# Patient Record
Sex: Female | Born: 1965 | Race: White | Hispanic: No | Marital: Married | Smoking: Never smoker
Health system: Southern US, Community
[De-identification: ages and names within clinical notes are randomized; demographics above are authoritative.]

## PROBLEM LIST (undated history)

## (undated) DIAGNOSIS — E785 Hyperlipidemia, unspecified: Secondary | ICD-10-CM

## (undated) DIAGNOSIS — K449 Diaphragmatic hernia without obstruction or gangrene: Secondary | ICD-10-CM

## (undated) DIAGNOSIS — I10 Essential (primary) hypertension: Secondary | ICD-10-CM

## (undated) DIAGNOSIS — E119 Type 2 diabetes mellitus without complications: Secondary | ICD-10-CM

## (undated) DIAGNOSIS — C4499 Other specified malignant neoplasm of skin, unspecified: Secondary | ICD-10-CM

## (undated) DIAGNOSIS — G629 Polyneuropathy, unspecified: Secondary | ICD-10-CM

## (undated) DIAGNOSIS — K219 Gastro-esophageal reflux disease without esophagitis: Secondary | ICD-10-CM

## (undated) DIAGNOSIS — S5400XA Injury of ulnar nerve at forearm level, unspecified arm, initial encounter: Secondary | ICD-10-CM

## (undated) DIAGNOSIS — C4490 Unspecified malignant neoplasm of skin, unspecified: Secondary | ICD-10-CM

## (undated) HISTORY — DX: Gastro-esophageal reflux disease without esophagitis: K21.9

## (undated) HISTORY — DX: Injury of ulnar nerve at forearm level, unspecified arm, initial encounter: S54.00XA

## (undated) HISTORY — DX: Diaphragmatic hernia without obstruction or gangrene: K44.9

## (undated) HISTORY — DX: Other specified malignant neoplasm of skin, unspecified: C44.99

## (undated) HISTORY — DX: Type 2 diabetes mellitus without complications: E11.9

## (undated) HISTORY — DX: Essential (primary) hypertension: I10

## (undated) HISTORY — DX: Hyperlipidemia, unspecified: E78.5

## (undated) HISTORY — PX: CHOLECYSTECTOMY: SHX55

## (undated) HISTORY — DX: Unspecified malignant neoplasm of skin, unspecified: C44.90

## (undated) HISTORY — PX: AUGMENTATION MAMMAPLASTY: SUR837

## (undated) HISTORY — PX: REDUCTION MAMMAPLASTY: SUR839

## (undated) HISTORY — DX: Polyneuropathy, unspecified: G62.9

## (undated) HISTORY — PX: ABDOMINAL HYSTERECTOMY: SHX81

---

## 1999-07-05 HISTORY — PX: BREAST REDUCTION SURGERY: SHX8

## 2005-08-12 DIAGNOSIS — E1142 Type 2 diabetes mellitus with diabetic polyneuropathy: Secondary | ICD-10-CM | POA: Insufficient documentation

## 2008-10-09 DIAGNOSIS — K8020 Calculus of gallbladder without cholecystitis without obstruction: Secondary | ICD-10-CM | POA: Insufficient documentation

## 2008-11-14 DIAGNOSIS — Z90710 Acquired absence of both cervix and uterus: Secondary | ICD-10-CM | POA: Insufficient documentation

## 2010-04-07 DIAGNOSIS — R928 Other abnormal and inconclusive findings on diagnostic imaging of breast: Secondary | ICD-10-CM | POA: Insufficient documentation

## 2016-06-20 DIAGNOSIS — Z85828 Personal history of other malignant neoplasm of skin: Secondary | ICD-10-CM | POA: Insufficient documentation

## 2016-10-17 HISTORY — PX: COLONOSCOPY: SHX174

## 2016-10-17 HISTORY — PX: ESOPHAGOGASTRODUODENOSCOPY: SHX1529

## 2016-10-17 LAB — HM COLONOSCOPY

## 2017-05-10 DIAGNOSIS — I10 Essential (primary) hypertension: Secondary | ICD-10-CM | POA: Insufficient documentation

## 2020-01-28 DIAGNOSIS — K219 Gastro-esophageal reflux disease without esophagitis: Secondary | ICD-10-CM | POA: Insufficient documentation

## 2020-01-28 HISTORY — PX: ESOPHAGOGASTRODUODENOSCOPY: SHX1529

## 2020-11-25 ENCOUNTER — Ambulatory Visit: Payer: Self-pay | Admitting: Family Medicine

## 2020-11-26 ENCOUNTER — Other Ambulatory Visit: Payer: Self-pay

## 2020-11-26 ENCOUNTER — Encounter: Payer: Self-pay | Admitting: Family Medicine

## 2020-11-26 ENCOUNTER — Ambulatory Visit (INDEPENDENT_AMBULATORY_CARE_PROVIDER_SITE_OTHER): Payer: Managed Care, Other (non HMO) | Admitting: Family Medicine

## 2020-11-26 VITALS — BP 159/73 | HR 71 | Temp 97.5°F | Ht 72.05 in | Wt 232.5 lb

## 2020-11-26 DIAGNOSIS — E1142 Type 2 diabetes mellitus with diabetic polyneuropathy: Secondary | ICD-10-CM

## 2020-11-26 DIAGNOSIS — Z1231 Encounter for screening mammogram for malignant neoplasm of breast: Secondary | ICD-10-CM | POA: Insufficient documentation

## 2020-11-26 DIAGNOSIS — I10 Essential (primary) hypertension: Secondary | ICD-10-CM

## 2020-11-26 DIAGNOSIS — E785 Hyperlipidemia, unspecified: Secondary | ICD-10-CM

## 2020-11-26 DIAGNOSIS — Z794 Long term (current) use of insulin: Secondary | ICD-10-CM

## 2020-11-26 DIAGNOSIS — K449 Diaphragmatic hernia without obstruction or gangrene: Secondary | ICD-10-CM

## 2020-11-26 DIAGNOSIS — K219 Gastro-esophageal reflux disease without esophagitis: Secondary | ICD-10-CM

## 2020-11-26 DIAGNOSIS — E1169 Type 2 diabetes mellitus with other specified complication: Secondary | ICD-10-CM

## 2020-11-26 MED ORDER — INSULIN GLARGINE 100 UNIT/ML ~~LOC~~ SOLN: 42.0000 [IU] | Freq: Every day | SUBCUTANEOUS | 1 refills | Status: DC

## 2020-11-26 MED ORDER — LOSARTAN POTASSIUM 100 MG PO TABS: 1.0000 | ORAL_TABLET | Freq: Every day | ORAL | 1 refills | Status: DC

## 2020-11-26 MED ORDER — PANTOPRAZOLE SODIUM 40 MG PO TBEC: 40.0000 mg | DELAYED_RELEASE_TABLET | Freq: Every day | ORAL | 1 refills | Status: DC

## 2020-11-26 MED ORDER — ATORVASTATIN CALCIUM 10 MG PO TABS: 10.0000 mg | ORAL_TABLET | Freq: Every day | ORAL | 1 refills | Status: DC

## 2020-11-26 NOTE — Assessment & Plan Note (Signed)
Continue PPI.  Referral to GI for increased symptoms.

## 2020-11-26 NOTE — Assessment & Plan Note (Signed)
Tolerating atorvastatin well, will reduce atorvastatin to 10mg .

## 2020-11-26 NOTE — Progress Notes (Signed)
Gwendolyn Fleming - 55 y.o. female MRN [id number]  Date of birth: 1966-01-24  Subjective Chief Complaint  Patient presents with  . Establish Care  . Diabetes  . Leg Swelling    HPI Gwendolyn Fleming is a 55 y.o. female here today for initial visit to establish care.  She and her husband recently moved here from [place].  She has a history of T2DM with HLD, HTN and peripheral neuropathy.  Records from [place] in [place] reviewed via [facility].  Diabetes is managed with combination of lantus 42 units daily and jardiance 25mg  daily.  She is doing well with this.  Her last a1c in 09/2020 was 7.1%.  She denies symptoms of hypoglycemia.  She does have associated neuropathy.  She also has hLD that is treated with atorvastatin 20mg .  Her last LDL was 46 in 06/2020.  No side effects with statin.   BP is treated with losartan 100mg  daily.  She is doing well with this. She denies side effects.  She has not had chest pain shortness of breath, palpitations, headache or vision changes.    She has had issue with hiatal hernia.  Feels like food gets stuck when swallowing.  She is taking pantoprazole but doesn't feel like his is fully controlling symptoms.    ROS:  A comprehensive ROS was completed and negative except as noted per HPI  ROS:  A comprehensive ROS was completed and negative except as noted per HPI   Allergies  Allergen Reactions  . Penicillins   . Cephalosporins   . Omeprazole Diarrhea  . Metformin Nausea And Vomiting    Upset stomach  . Other Rash    General Anesthesia  . Tape Rash    Past Medical History:  Diagnosis Date  . GERD (gastroesophageal reflux disease)   . Hiatal hernia   . Hyperlipemia   . Hypertension   . Peripheral neuropathy   . Skin carcinoma   . Type II diabetes mellitus ([place])   . Ulnar nerve damage    bilateral    Past Surgical History:  Procedure Laterality Date  . ABDOMINAL HYSTERECTOMY    . BREAST REDUCTION SURGERY    . CESAREAN SECTION    .  CHOLECYSTECTOMY      Social History   Socioeconomic History  . Marital status: Married    Spouse name: Adisen Bennion  . Number of children: Not on file  . Years of education: Not on file  . Highest education level: Not on file  Occupational History  . Occupation: Ambulance person  Tobacco Use  . Smoking status: Never Smoker  . Smokeless tobacco: Never Used  Vaping Use  . Vaping Use: Never used  Substance and Sexual Activity  . Alcohol use: Never  . Drug use: Never  . Sexual activity: Not Currently    Partners: Male  Other Topics Concern  . Not on file  Social History Narrative  . Not on file   Social Determinants of Health   Financial Resource Strain: Not on file  Food Insecurity: Not on file  Transportation Needs: Not on file  Physical Activity: Not on file  Stress: Not on file  Social Connections: Not on file    Family History  Problem Relation Age of Onset  . Skin cancer Mother   . Diabetes Father   . Hypertension Father   . Diabetes Brother   . Diabetes Maternal Grandmother   . Breast cancer Cousin     Health Maintenance  Topic Date Due  .  HEMOGLOBIN A1C  Never done  . COVID-19 Vaccine (1) Never done  . FOOT EXAM  Never done  . OPHTHALMOLOGY EXAM  Never done  . HIV Screening  Never done  . Hepatitis C Screening  Never done  . COLONOSCOPY (Pts 45-69yrs Insurance coverage will need to be confirmed)  Never done  . MAMMOGRAM  Never done  . Zoster Vaccines- Shingrix (1 of 2) Never done  . INFLUENZA VACCINE  02/01/2021  . TETANUS/TDAP  08/01/2030  . PNEUMOCOCCAL POLYSACCHARIDE VACCINE AGE 66-64 HIGH RISK  Completed  . HPV VACCINES  Aged Out  . PAP SMEAR-Modifier  Discontinued     ----------------------------------------------------------------------------------------------------------------------------------------------------------------------------------------------------------------- Physical Exam BP (!) 159/73 (BP Location: Left Arm, Patient  Position: Sitting, Cuff Size: Large)   Pulse 71   Temp (!) 97.5 F (36.4 C)   Ht 6' 0.05" (1.83 m)   Wt 232 lb 8 oz (105.5 kg)   SpO2 99%   BMI 31.49 kg/m   Physical Exam Constitutional:      Appearance: Normal appearance.  HENT:     Head: Normocephalic and atraumatic.  Eyes:     General: No scleral icterus. Musculoskeletal:     Cervical back: Neck supple.  Neurological:     General: No focal deficit present.     Mental Status: She is alert.  Psychiatric:        Mood and Affect: Mood normal.        Behavior: Behavior normal.     ------------------------------------------------------------------------------------------------------------------------------------------------------------------------------------------------------------------- Assessment and Plan  HTN (hypertension) BP elevated today.  Has been historically controlled with losartan.  Continue at current dose and recommend low sodium diet.  Recommend home monitoring as well.   Type 2 diabetes mellitus with diabetic polyneuropathy (HCC) Recently a1c indicates fairly good control of glucose.  Continue lantus at current dosing and jardiance.  Return in about 3 months (around 02/26/2021) for HTN/T2DM.   Encounter for screening mammogram for malignant neoplasm of breast Referral for mammogram  Hiatal hernia with GERD Continue PPI.  Referral to GI for increased symptoms.   Hyperlipidemia associated with type 2 diabetes mellitus ([place]) Tolerating atorvastatin well, will reduce atorvastatin to 10mg .    Meds ordered this encounter  Medications  . insulin glargine (LANTUS) 100 UNIT/ML injection    Sig: Inject 0.42 mLs (42 Units total) into the skin daily.    Dispense:  40 mL    Refill:  1  . losartan (COZAAR) 100 MG tablet    Sig: Take 1 tablet (100 mg total) by mouth daily.    Dispense:  90 tablet    Refill:  1  . pantoprazole (PROTONIX) 40 MG tablet    Sig: Take 1 tablet (40 mg total) by mouth daily.     Dispense:  90 tablet    Refill:  1  . atorvastatin (LIPITOR) 10 MG tablet    Sig: Take 1 tablet (10 mg total) by mouth daily.    Dispense:  90 tablet    Refill:  1    Return in about 3 months (around 02/26/2021) for HTN/T2DM.    This visit occurred during the SARS-CoV-2 public health emergency.  Safety protocols were in place, including screening questions prior to the visit, additional usage of staff PPE, and extensive cleaning of exam room while observing appropriate contact time as indicated for disinfecting solutions.

## 2020-11-26 NOTE — Patient Instructions (Addendum)
Great to meet you today! Let's try decreasing atorvastatin to 10mg  daily.  Continue all other medications.  Follow up with me in 3 months.

## 2020-11-26 NOTE — Assessment & Plan Note (Signed)
BP elevated today.  Has been historically controlled with losartan.  Continue at current dose and recommend low sodium diet.  Recommend home monitoring as well.

## 2020-11-26 NOTE — Assessment & Plan Note (Signed)
Recently a1c indicates fairly good control of glucose.  Continue lantus at current dosing and jardiance.  Return in about 3 months (around 02/26/2021) for HTN/T2DM.

## 2020-11-26 NOTE — Assessment & Plan Note (Signed)
Referral for mammogram

## 2020-11-27 ENCOUNTER — Telehealth: Payer: Self-pay | Admitting: Gastroenterology

## 2020-11-27 NOTE — Telephone Encounter (Signed)
Hey Dr Lyndel Safe, this pt is being referred to [place] from [facility] for a hiatal hernia but it looks like the pt has seen [facility] GI 01/2020, records are In epic for review, please advise on scheduling.

## 2020-12-02 NOTE — Telephone Encounter (Signed)
OK to schedule for OV APP clinic or mine, whichever is sooner RG

## 2020-12-17 ENCOUNTER — Other Ambulatory Visit: Payer: Self-pay

## 2020-12-17 ENCOUNTER — Ambulatory Visit (INDEPENDENT_AMBULATORY_CARE_PROVIDER_SITE_OTHER): Payer: Managed Care, Other (non HMO)

## 2020-12-17 DIAGNOSIS — Z1231 Encounter for screening mammogram for malignant neoplasm of breast: Secondary | ICD-10-CM | POA: Diagnosis not present

## 2020-12-30 ENCOUNTER — Other Ambulatory Visit: Payer: Self-pay

## 2020-12-30 ENCOUNTER — Encounter: Payer: Self-pay | Admitting: Family Medicine

## 2020-12-30 DIAGNOSIS — E1142 Type 2 diabetes mellitus with diabetic polyneuropathy: Secondary | ICD-10-CM

## 2020-12-30 MED ORDER — "BD VEO INSULIN SYRINGE U/F 31G X 15/64"" 0.5 ML MISC": 2 refills | Status: DC

## 2020-12-31 ENCOUNTER — Other Ambulatory Visit: Payer: Self-pay | Admitting: Family Medicine

## 2020-12-31 MED ORDER — BASAGLAR KWIKPEN 100 UNIT/ML ~~LOC~~ SOPN: 42.0000 [IU] | PEN_INJECTOR | Freq: Every day | SUBCUTANEOUS | 2 refills | Status: DC

## 2020-12-31 MED ORDER — NOVOFINE PEN NEEDLE 32G X 6 MM MISC: 3 refills | Status: DC

## 2021-01-09 LAB — HM DIABETES EYE EXAM

## 2021-01-15 ENCOUNTER — Encounter: Payer: Self-pay | Admitting: Family Medicine

## 2021-01-15 NOTE — Progress Notes (Signed)
Dr. Clent Demark in [place]  Fax: [phone]

## 2021-01-20 ENCOUNTER — Telehealth: Payer: Self-pay | Admitting: Neurology

## 2021-01-20 NOTE — Telephone Encounter (Signed)
Archived, patient's therapy switched by provider.

## 2021-02-02 ENCOUNTER — Other Ambulatory Visit: Payer: Self-pay

## 2021-02-25 ENCOUNTER — Ambulatory Visit: Payer: Managed Care, Other (non HMO) | Admitting: Family Medicine

## 2021-02-25 ENCOUNTER — Encounter: Payer: Self-pay | Admitting: Family Medicine

## 2021-02-25 ENCOUNTER — Other Ambulatory Visit: Payer: Self-pay

## 2021-02-25 VITALS — BP 135/80 | HR 66 | Temp 97.6°F | Ht 72.0 in | Wt 230.0 lb

## 2021-02-25 DIAGNOSIS — E1169 Type 2 diabetes mellitus with other specified complication: Secondary | ICD-10-CM

## 2021-02-25 DIAGNOSIS — E1142 Type 2 diabetes mellitus with diabetic polyneuropathy: Secondary | ICD-10-CM | POA: Diagnosis not present

## 2021-02-25 DIAGNOSIS — I10 Essential (primary) hypertension: Secondary | ICD-10-CM

## 2021-02-25 DIAGNOSIS — R1319 Other dysphagia: Secondary | ICD-10-CM | POA: Diagnosis not present

## 2021-02-25 DIAGNOSIS — Z794 Long term (current) use of insulin: Secondary | ICD-10-CM

## 2021-02-25 DIAGNOSIS — E785 Hyperlipidemia, unspecified: Secondary | ICD-10-CM

## 2021-02-25 MED ORDER — NOVOFINE PEN NEEDLE 32G X 6 MM MISC: 3 refills | Status: DC

## 2021-02-25 MED ORDER — NOVOFINE PEN NEEDLE 32G X 6 MM MISC: 0 refills | Status: DC

## 2021-02-25 NOTE — Patient Instructions (Signed)
Great to see you today! I will have GI office reach out to you.  Continue current medication.  Follow up in 6 months.

## 2021-02-26 LAB — CBC WITH DIFFERENTIAL/PLATELET
Absolute Monocytes: 609 cells/uL (ref 200–950)
Basophils Absolute: 26 cells/uL (ref 0–200)
Basophils Relative: 0.3 %
Eosinophils Absolute: 157 cells/uL (ref 15–500)
Eosinophils Relative: 1.8 %
HCT: 42.2 % (ref 35.0–45.0)
Hemoglobin: 14.2 g/dL (ref 11.7–15.5)
Lymphs Abs: 2018 cells/uL (ref 850–3900)
MCH: 30.6 pg (ref 27.0–33.0)
MCHC: 33.6 g/dL (ref 32.0–36.0)
MCV: 90.9 fL (ref 80.0–100.0)
MPV: 10.2 fL (ref 7.5–12.5)
Monocytes Relative: 7 %
Neutro Abs: 5890 cells/uL (ref 1500–7800)
Neutrophils Relative %: 67.7 %
Platelets: 271 10*3/uL (ref 140–400)
RBC: 4.64 10*6/uL (ref 3.80–5.10)
RDW: 12.3 % (ref 11.0–15.0)
Total Lymphocyte: 23.2 %
WBC: 8.7 10*3/uL (ref 3.8–10.8)

## 2021-02-26 LAB — COMPLETE METABOLIC PANEL WITH GFR
AG Ratio: 1.4 (calc) (ref 1.0–2.5)
ALT: 20 U/L (ref 6–29)
AST: 15 U/L (ref 10–35)
Albumin: 4 g/dL (ref 3.6–5.1)
Alkaline phosphatase (APISO): 81 U/L (ref 37–153)
BUN: 12 mg/dL (ref 7–25)
CO2: 29 mmol/L (ref 20–32)
Calcium: 9.4 mg/dL (ref 8.6–10.4)
Chloride: 102 mmol/L (ref 98–110)
Creat: 0.75 mg/dL (ref 0.50–1.03)
Globulin: 2.9 g/dL (calc) (ref 1.9–3.7)
Glucose, Bld: 119 mg/dL — ABNORMAL HIGH (ref 65–99)
Potassium: 4.7 mmol/L (ref 3.5–5.3)
Sodium: 139 mmol/L (ref 135–146)
Total Bilirubin: 0.3 mg/dL (ref 0.2–1.2)
Total Protein: 6.9 g/dL (ref 6.1–8.1)
eGFR: 93 mL/min/{1.73_m2} (ref >=60)

## 2021-02-26 LAB — LIPID PANEL W/REFLEX DIRECT LDL
Cholesterol: 157 mg/dL (ref <200)
HDL: 45 mg/dL — ABNORMAL LOW (ref >=50)
LDL Cholesterol (Calc): 70 mg/dL (calc)
Non-HDL Cholesterol (Calc): 112 mg/dL (calc) (ref <130)
Total CHOL/HDL Ratio: 3.5 (calc) (ref <5.0)
Triglycerides: 347 mg/dL — ABNORMAL HIGH (ref <150)

## 2021-02-26 LAB — HEMOGLOBIN A1C
Hgb A1c MFr Bld: 6.7 % of total Hgb — ABNORMAL HIGH (ref <5.7)
Mean Plasma Glucose: 146 mg/dL
eAG (mmol/L): 8.1 mmol/L

## 2021-02-26 MED ORDER — NOVOFINE PEN NEEDLE 32G X 6 MM MISC: 0 refills | Status: DC

## 2021-02-28 ENCOUNTER — Encounter: Payer: Self-pay | Admitting: Family Medicine

## 2021-02-28 DIAGNOSIS — R1319 Other dysphagia: Secondary | ICD-10-CM | POA: Insufficient documentation

## 2021-02-28 NOTE — Assessment & Plan Note (Signed)
She is tolerating atorvastatin well.  Updating lipid panel today.

## 2021-02-28 NOTE — Progress Notes (Signed)
Gwendolyn Fleming - 55 y.o. female MRN [id number]  Date of birth: [birth year]  Subjective Chief Complaint  Patient presents with   Diabetes    HPI Gwendolyn Fleming is a 55 year old female here today for follow-up visit.  She has a history of diabetes and is treated with basal insulin and Jardiance.  She was using Lantus however this was no longer covered with her new insurance.  She was transitioned to [employer] however is unsure how to use these.  She does not currently have pen needles and is going out of town this week.  She does think that she has enough Lantus in her current vial to make it through the weekend.  She reports that overall she is doing well with managing her blood sugars.  She is tolerating atorvastatin well for management of associated hyperlipidemia.  GERD is managed with pantoprazole.  This continues to work well for her reflux symptoms however she is having some esophageal dysphagia.  She has had esophageal stretching in the past.  She is planning on starting a new supplement from [employer] to hopefully help with her gut health.  She is a firm believer in their essential oil products as well.  ROS:  A comprehensive ROS was completed and negative except as noted per HPI  Allergies  Allergen Reactions   Penicillins    Cephalosporins    Omeprazole Diarrhea   Metformin Nausea And Vomiting    Upset stomach   Other Rash    General Anesthesia   Tape Rash    Past Medical History:  Diagnosis Date   GERD (gastroesophageal reflux disease)    Hiatal hernia    Hyperlipemia    Hypertension    Peripheral neuropathy    Skin carcinoma    Type II diabetes mellitus ([place])    Ulnar nerve damage    bilateral    Past Surgical History:  Procedure Laterality Date   ABDOMINAL HYSTERECTOMY     AUGMENTATION MAMMAPLASTY     BREAST REDUCTION SURGERY Bilateral 2001   CESAREAN SECTION     CHOLECYSTECTOMY      Social History   Socioeconomic History   Marital status: Married    Spouse  name: Gwendolyn Fleming   Number of children: Not on file   Years of education: Not on file   Highest education level: Not on file  Occupational History   Occupation: Ambulance person  Tobacco Use   Smoking status: Never   Smokeless tobacco: Never  Vaping Use   Vaping Use: Never used  Substance and Sexual Activity   Alcohol use: Never   Drug use: Never   Sexual activity: Not Currently    Partners: Male  Other Topics Concern   Not on file  Social History Narrative   Not on file   Social Determinants of Health   Financial Resource Strain: Not on file  Food Insecurity: Not on file  Transportation Needs: Not on file  Physical Activity: Not on file  Stress: Not on file  Social Connections: Not on file    Family History  Problem Relation Age of Onset   Skin cancer Mother    Diabetes Father    Hypertension Father    Diabetes Brother    Diabetes Maternal Grandmother    Breast cancer Cousin     Health Maintenance  Topic Date Due   COVID-19 Vaccine (1) Never done   OPHTHALMOLOGY EXAM  Never done   HIV Screening  Never done   Hepatitis C Screening  Never done   Zoster Vaccines- Shingrix (1 of 2) Never done   INFLUENZA VACCINE  02/01/2021   HEMOGLOBIN A1C  08/28/2021   FOOT EXAM  02/25/2022   MAMMOGRAM  12/18/2022   COLONOSCOPY (Pts 45-70yr Insurance coverage will need to be confirmed)  10/18/2026   TETANUS/TDAP  08/01/2030   PNEUMOCOCCAL POLYSACCHARIDE VACCINE AGE 55-64 HIGH RISK  Completed   Pneumococcal Vaccine [id number] old  Aged Out   HPV VACCINES  Aged Out   PAP SMEAR-Modifier  Discontinued     ----------------------------------------------------------------------------------------------------------------------------------------------------------------------------------------------------------------- Physical Exam BP 135/80 (BP Location: Left Arm, Patient Position: Sitting, Cuff Size: Large)   Pulse 66   Temp 97.6 F (36.4 C)   Ht 6' (1.829 m)   Wt 230  lb (104.3 kg)   SpO2 93%   BMI 31.19 kg/m   Physical Exam Constitutional:      Appearance: Normal appearance.  HENT:     Head: Normocephalic and atraumatic.  Eyes:     General: No scleral icterus. Cardiovascular:     Rate and Rhythm: Normal rate and regular rhythm.  Musculoskeletal:     Cervical back: Neck supple.  Neurological:     General: No focal deficit present.     Mental Status: She is alert.  Psychiatric:        Mood and Affect: Mood normal.        Behavior: Behavior normal.    ------------------------------------------------------------------------------------------------------------------------------------------------------------------------------------------------------------------- Assessment and Plan  HTN (hypertension) Blood pressure remains well controlled with losartan.  Recommend continuation.  Type 2 diabetes mellitus with diabetic polyneuropathy (HCC) Blood sugars at home have been well controlled.  Updating A1c today.  Instructed on use of Basaglar pen and prescription for pen needles sent in.  She will continue Jardiance in addition to [employer]  Hyperlipidemia associated with type 2 diabetes mellitus ([place] She is tolerating atorvastatin well.  Updating lipid panel today.  Esophageal dysphagia She does have history of hiatal hernia with GERD.  Has required esophageal dilatation in the past.  Referral placed to GI.   Meds ordered this encounter  Medications   DISCONTD: Insulin Pen Needle (NOVOFINE PEN NEEDLE) 32G X 6 MM MISC    Sig: Use to inject insulin    Dispense:  100 each    Refill:  3   DISCONTD: Insulin Pen Needle (NOVOFINE PEN NEEDLE) 32G X 6 MM MISC    Sig: Use to inject insulin    Dispense:  100 each    Refill:  0    Return in about 6 months (around 08/28/2021) for HTN/T2DM.    This visit occurred during the SARS-CoV-2 public health emergency.  Safety protocols were in place, including screening questions prior to the visit,  additional usage of staff PPE, and extensive cleaning of exam room while observing appropriate contact time as indicated for disinfecting solutions.

## 2021-02-28 NOTE — Assessment & Plan Note (Signed)
Blood pressure remains well controlled with losartan.  Recommend continuation.

## 2021-02-28 NOTE — Assessment & Plan Note (Signed)
She does have history of hiatal hernia with GERD.  Has required esophageal dilatation in the past.  Referral placed to GI.

## 2021-02-28 NOTE — Assessment & Plan Note (Signed)
Blood sugars at home have been well controlled.  Updating A1c today.  Instructed on use of Basaglar pen and prescription for pen needles sent in.  She will continue Jardiance in addition to [employer].

## 2021-03-01 ENCOUNTER — Encounter: Payer: Self-pay | Admitting: Family Medicine

## 2021-03-15 ENCOUNTER — Encounter: Payer: Self-pay | Admitting: Family Medicine

## 2021-03-15 MED ORDER — EMPAGLIFLOZIN 25 MG PO TABS: 25.0000 mg | ORAL_TABLET | Freq: Every day | ORAL | 3 refills | Status: DC

## 2021-03-15 NOTE — Telephone Encounter (Signed)
We have not prescribed this medication for the patient previously.  Please review and refill if appropriate.  T. Emma-Lee Oddo, CMA  

## 2021-03-24 ENCOUNTER — Telehealth: Payer: Self-pay

## 2021-03-24 DIAGNOSIS — Z794 Long term (current) use of insulin: Secondary | ICD-10-CM

## 2021-03-24 NOTE — Telephone Encounter (Signed)
Pt called stating that she is out of Jardiance and cannot get it filled until a PA is completed.  Please call pt and update her on the status of PA.  Charyl Bigger, CMA

## 2021-04-07 MED ORDER — BASAGLAR KWIKPEN 100 UNIT/ML ~~LOC~~ SOPN: 42.0000 [IU] | PEN_INJECTOR | Freq: Every day | SUBCUTANEOUS | 2 refills | Status: DC

## 2021-04-07 NOTE — Telephone Encounter (Signed)
Medication: empagliflozin (JARDIANCE) 25 MG TABS tablet Prior authorization determination received Medication has been approved Approval dates: 04/07/2021-07/03/2098  Patient aware via: phone Pharmacy aware: Yes Provider aware via this encounter

## 2021-04-07 NOTE — Addendum Note (Signed)
Addended by: Ranelle Oyster on: 04/07/2021 10:51 AM   Modules accepted: Orders

## 2021-04-07 NOTE — Telephone Encounter (Signed)
Medication: empagliflozin (JARDIANCE) 25 MG TABS tablet Prior authorization submitted via CoverMyMeds on 04/07/2021 PA submission pending MyChart message sent to pt notifying her of the PA submission

## 2021-04-12 NOTE — Telephone Encounter (Signed)
Please see encounter dated 04/07/21

## 2021-04-14 ENCOUNTER — Encounter: Payer: Self-pay | Admitting: Gastroenterology

## 2021-04-14 ENCOUNTER — Other Ambulatory Visit: Payer: Self-pay

## 2021-04-14 ENCOUNTER — Ambulatory Visit: Payer: Managed Care, Other (non HMO) | Admitting: Gastroenterology

## 2021-04-14 VITALS — BP 136/86 | HR 64 | Ht 72.0 in | Wt 226.5 lb

## 2021-04-14 DIAGNOSIS — K219 Gastro-esophageal reflux disease without esophagitis: Secondary | ICD-10-CM | POA: Diagnosis not present

## 2021-04-14 DIAGNOSIS — R1319 Other dysphagia: Secondary | ICD-10-CM | POA: Diagnosis not present

## 2021-04-14 DIAGNOSIS — K449 Diaphragmatic hernia without obstruction or gangrene: Secondary | ICD-10-CM | POA: Diagnosis not present

## 2021-04-14 NOTE — Progress Notes (Addendum)
Chief Complaint: Dysphagia  Referring Provider:  Luetta Nutting, DO      ASSESSMENT AND PLAN;   #1. GERD with small HH  #2. Eso dysphagia. H/O eso stricture 2018.  Most recent EGD 01/2020 did not reveal any stricture  Plan: -Ba swallow with barium tablet -Hold off on PPIs (pt preference) -Instructed to chew food specially meats and breads well and slowly. -EGD with dil if needed (would prefer Jan 2022)  HPI:    Gwendolyn Fleming is a 55 y.o. female  With DM2, non-celiac gluten sensitivity, HTN, HLD, peripheral neuropathy who does not like to take medications and prefers natural therapy including essential oils.  C/O dysphagia - intermittent, more with rice, worsening x 2-3 months, mostly in the mid chest.  No associated heartburn.  An EGD done previously as below showing small hiatal hernia.  She also had esophageal dilatation in April 2018.  She was given a trial of Protonix which worked good.  However, she weaned herself off Protonix and is currently using essential oils.  No further heartburn.  Note that she had diarrhea with omeprazole.  She denies having any odynophagia or dysphagia.  No melena or hematochezia.  No unintentional weight loss.  She has normal bowel movements.  Used to be more constipated before gallbladder surgery.  No sodas, chocolates, chewing gums, artificial sweeteners and candy. No NSAIDs   Past GI procedures:  EGD @[place]  by Dr Jeris Penta 01/28/2020 -Small hiatal hernia -No strictures. Eso Bx- neg for EOE or Barrett's. Eso bx did show acute and chronic inflammation. -Several 2 to 4 mm gastric polyps. Bx-fundic gland polyps  EGD 10/2016 -Small hiatal hernia -GE junction stricture s/p dil 84mm  Screening colonoscopy 10/17/2016: Nl. Rpt in 10 yrs  Past Medical History:  Diagnosis Date   GERD (gastroesophageal reflux disease)    Hiatal hernia    Hyperlipemia    Hypertension    Peripheral neuropathy    Skin carcinoma    Type II diabetes mellitus  ([place])    Ulnar nerve damage    bilateral    Past Surgical History:  Procedure Laterality Date   ABDOMINAL HYSTERECTOMY     AUGMENTATION MAMMAPLASTY     BREAST REDUCTION SURGERY Bilateral 2001   CESAREAN SECTION     CHOLECYSTECTOMY      Family History  Problem Relation Age of Onset   Skin cancer Mother    Irritable bowel syndrome Mother    Diabetes Father    Hypertension Father    Diabetes Brother    Diabetes Maternal Grandmother    Diverticulitis Maternal Grandmother    Breast cancer Cousin    Colon cancer Neg Hx    Rectal cancer Neg Hx    Pancreatic cancer Neg Hx    Esophageal cancer Neg Hx    Liver cancer Neg Hx    Stomach cancer Neg Hx     Social History   Tobacco Use   Smoking status: Never   Smokeless tobacco: Never  Vaping Use   Vaping Use: Never used  Substance Use Topics   Alcohol use: Never   Drug use: Never    Current Outpatient Medications  Medication Sig Dispense Refill   Alcohol Swabs (ALCOHOL WIPES) 70 % PADS Apply topically See admin instructions.     atorvastatin (LIPITOR) 10 MG tablet Take 1 tablet (10 mg total) by mouth daily. 90 tablet 1   empagliflozin (JARDIANCE) 25 MG TABS tablet Take 1 tablet (25 mg total) by mouth daily. 90 tablet  3   glucose blood (ONETOUCH VERIO) test strip Check your blood sugar 2 times a day (Every morning before breakfast and every evening before dinner)     Insulin Glargine (BASAGLAR KWIKPEN) 100 UNIT/ML Inject 42 Units into the skin daily. 15 mL 2   Insulin Pen Needle (NOVOFINE PEN NEEDLE) 32G X 6 MM MISC Use to inject insulin 100 each 0   Lancets (ONETOUCH DELICA PLUS [id number]) MISC Use as directed to test blood sugar. For use with Delica Lancing device     losartan (COZAAR) 100 MG tablet Take 1 tablet (100 mg total) by mouth daily. 90 tablet 1   No current facility-administered medications for this visit.    Allergies  Allergen Reactions   Penicillins    Cephalosporins    Omeprazole Diarrhea   Metformin  Nausea And Vomiting    Upset stomach   Other Rash    General Anesthesia   Tape Rash    Review of Systems:  Constitutional: Denies fever, chills, diaphoresis, appetite change and fatigue.  HEENT: Denies photophobia, eye pain, redness, hearing loss, ear pain, congestion, sore throat, rhinorrhea, sneezing, mouth sores, neck pain, neck stiffness and tinnitus.   Respiratory: Denies SOB, DOE, cough, chest tightness,  and wheezing.   Cardiovascular: Denies chest pain, palpitations and leg swelling.  Genitourinary: Denies dysuria, urgency, frequency, hematuria, flank pain and difficulty urinating.  Musculoskeletal: Denies myalgias, back pain, joint swelling, arthralgias and gait problem.  Skin: No rash.  Neurological: Denies dizziness, seizures, syncope, weakness, light-headedness, numbness and headaches.  Hematological: Denies adenopathy. Easy bruising, personal or family bleeding history  Psychiatric/Behavioral: No anxiety or depression     Physical Exam:    BP 136/86   Pulse 64   Ht 6' (1.829 m)   Wt 226 lb 8 oz (102.7 kg)   SpO2 98%   BMI 30.72 kg/m  Wt Readings from Last 3 Encounters:  04/14/21 226 lb 8 oz (102.7 kg)  02/25/21 230 lb (104.3 kg)  11/26/20 232 lb 8 oz (105.5 kg)   Constitutional:  Well-developed, in no acute distress. Psychiatric: Normal mood and affect. Behavior is normal. HEENT: Pupils normal.  Conjunctivae are normal. No scleral icterus. Cardiovascular: Normal rate, regular rhythm. No edema Pulmonary/chest: Effort normal and breath sounds normal. No wheezing, rales or rhonchi. Abdominal: Soft, nondistended. Nontender. Bowel sounds active throughout. There are no masses palpable. No hepatomegaly. Rectal: Deferred Neurological: Alert and oriented to person place and time. Skin: Skin is warm and dry. No rashes noted.  Data Reviewed: I have personally reviewed following labs and imaging studies  CBC: CBC Latest Ref Rng & Units 02/25/2021  WBC 3.8 - 10.8  Thousand/uL 8.7  Hemoglobin 11.7 - 15.5 g/dL 14.2  Hematocrit 35.0 - 45.0 % 42.2  Platelets 140 - 400 Thousand/uL 271    CMP: CMP Latest Ref Rng & Units 02/25/2021  Glucose 65 - 99 mg/dL 119(H)  BUN 7 - 25 mg/dL 12  Creatinine 0.50 - 1.03 mg/dL 0.75  Sodium 135 - 146 mmol/L 139  Potassium 3.5 - 5.3 mmol/L 4.7  Chloride 98 - 110 mmol/L 102  CO2 20 - 32 mmol/L 29  Calcium 8.6 - 10.4 mg/dL 9.4  Total Protein 6.1 - 8.1 g/dL 6.9  Total Bilirubin 0.2 - 1.2 mg/dL 0.3  AST 10 - 35 U/L 15  ALT 6 - 29 U/L 20       Carmell Austria, MD 04/14/2021, 9:23 AM  Cc: Luetta Nutting, DO

## 2021-04-14 NOTE — Patient Instructions (Signed)
If you are age 57 or older, your body mass index should be between 23-30. Your Body mass index is 30.72 kg/m. If this is out of the aforementioned range listed, please consider follow up with your Primary Care Provider.  If you are age 66 or younger, your body mass index should be between 19-25. Your Body mass index is 30.72 kg/m. If this is out of the aformentioned range listed, please consider follow up with your Primary Care Provider.   __________________________________________________________  The [facility] providers would like to encourage you to use [facility] to communicate with providers for non-urgent requests or questions.  Due to long hold times on the telephone, sending your provider a message by [facility] may be a faster and more efficient way to get a response.  Please allow 48 business hours for a response.  Please remember that this is for non-urgent requests.    You have been scheduled for a Barium Esophogram at [facility] (1st floor of the hospital) on  04-23-2021 at 1030am . Please arrive 15 minutes prior to your appointment for registration. Make certain not to have anything to eat or drink 3 hours prior to your test. If you need to reschedule for any reason, please contact radiology at [phone] to do so. __________________________________________________________________ A barium swallow is an examination that concentrates on views of the esophagus. This tends to be a double contrast exam (barium and two liquids which, when combined, create a gas to distend the wall of the oesophagus) or single contrast (non-ionic iodine based). The study is usually tailored to your symptoms so a good history is essential. Attention is paid during the study to the form, structure and configuration of the esophagus, looking for functional disorders (such as aspiration, dysphagia, achalasia, motility and reflux) EXAMINATION You may be asked to change into a gown, depending on the type of  swallow being performed. A radiologist and radiographer will perform the procedure. The radiologist will advise you of the type of contrast selected for your procedure and direct you during the exam. You will be asked to stand, sit or lie in several different positions and to hold a small amount of fluid in your mouth before being asked to swallow while the imaging is performed .In some instances you may be asked to swallow barium coated marshmallows to assess the motility of a solid food bolus. The exam can be recorded as a digital or video fluoroscopy procedure. POST PROCEDURE It will take 1-2 days for the barium to pass through your system. To facilitate this, it is important, unless otherwise directed, to increase your fluids for the next 24-48hrs and to resume your normal diet.  This test typically takes about 30 minutes to perform. __________________________________________________________________________________  Please call with any questions or concerns.  Thank you,  Dr. Jackquline Denmark

## 2021-04-23 ENCOUNTER — Ambulatory Visit (HOSPITAL_COMMUNITY)
Admission: RE | Admit: 2021-04-23 | Discharge: 2021-04-23 | Disposition: A | Discharge status: Home / Self Care | Payer: Managed Care, Other (non HMO) | Source: Ambulatory Visit | Attending: Gastroenterology | Admitting: Gastroenterology

## 2021-04-23 ENCOUNTER — Other Ambulatory Visit: Payer: Self-pay

## 2021-04-23 DIAGNOSIS — K219 Gastro-esophageal reflux disease without esophagitis: Secondary | ICD-10-CM | POA: Insufficient documentation

## 2021-04-23 DIAGNOSIS — R1319 Other dysphagia: Secondary | ICD-10-CM | POA: Insufficient documentation

## 2021-04-23 DIAGNOSIS — K449 Diaphragmatic hernia without obstruction or gangrene: Secondary | ICD-10-CM | POA: Insufficient documentation

## 2021-04-28 ENCOUNTER — Other Ambulatory Visit: Payer: Self-pay

## 2021-04-28 DIAGNOSIS — R1319 Other dysphagia: Secondary | ICD-10-CM

## 2021-05-12 ENCOUNTER — Other Ambulatory Visit: Payer: Self-pay

## 2021-05-12 DIAGNOSIS — E1142 Type 2 diabetes mellitus with diabetic polyneuropathy: Secondary | ICD-10-CM

## 2021-05-12 DIAGNOSIS — Z794 Long term (current) use of insulin: Secondary | ICD-10-CM

## 2021-05-12 MED ORDER — BASAGLAR KWIKPEN 100 UNIT/ML ~~LOC~~ SOPN: 42.0000 [IU] | PEN_INJECTOR | Freq: Every day | SUBCUTANEOUS | 2 refills | Status: DC

## 2021-05-13 ENCOUNTER — Other Ambulatory Visit: Payer: Self-pay | Admitting: Family Medicine

## 2021-05-14 MED ORDER — NOVOFINE PEN NEEDLE 32G X 6 MM MISC: 0 refills | Status: DC

## 2021-05-17 ENCOUNTER — Ambulatory Visit (AMBULATORY_SURGERY_CENTER): Payer: Managed Care, Other (non HMO) | Admitting: Gastroenterology

## 2021-05-17 ENCOUNTER — Encounter: Payer: Self-pay | Admitting: Gastroenterology

## 2021-05-17 VITALS — BP 128/64 | HR 78 | Temp 97.3°F | Resp 14 | Ht 72.0 in | Wt 226.0 lb

## 2021-05-17 DIAGNOSIS — K317 Polyp of stomach and duodenum: Secondary | ICD-10-CM

## 2021-05-17 DIAGNOSIS — R1319 Other dysphagia: Secondary | ICD-10-CM

## 2021-05-17 DIAGNOSIS — K219 Gastro-esophageal reflux disease without esophagitis: Secondary | ICD-10-CM | POA: Diagnosis not present

## 2021-05-17 DIAGNOSIS — K222 Esophageal obstruction: Secondary | ICD-10-CM | POA: Diagnosis not present

## 2021-05-17 DIAGNOSIS — K449 Diaphragmatic hernia without obstruction or gangrene: Secondary | ICD-10-CM | POA: Diagnosis not present

## 2021-05-17 DIAGNOSIS — N63 Unspecified lump in unspecified breast: Secondary | ICD-10-CM | POA: Diagnosis not present

## 2021-05-17 MED ORDER — SODIUM CHLORIDE 0.9 % IV SOLN: 500.0000 mL | Freq: Once | INTRAVENOUS | Status: DC

## 2021-05-17 MED ORDER — PANTOPRAZOLE SODIUM 40 MG PO TBEC: 40.0000 mg | DELAYED_RELEASE_TABLET | Freq: Every day | ORAL | 4 refills | Status: DC

## 2021-05-17 NOTE — Progress Notes (Signed)
Pt's states no medical or surgical changes since previsit or office visit. 

## 2021-05-17 NOTE — Op Note (Signed)
[place] Patient Name: Gwendolyn Fleming Procedure Date: 05/17/2021 9:45 AM MRN: [id number] Endoscopist: Jackquline Denmark , MD Age: 55 Referring MD:  Date of Birth: December 10, 1965 Gender: Female Account #: [id number] Procedure:                Upper GI endoscopy Indications:              Dysphagia with barium swallow showing small hiatal                            hernia. H/O distal esophageal stricture s/p                            dilatation in past. Medicines:                Monitored Anesthesia Care Procedure:                Pre-Anesthesia Assessment:                           - Prior to the procedure, a History and Physical                            was performed, and patient medications and                            allergies were reviewed. The patient's tolerance of                            previous anesthesia was also reviewed. The risks                            and benefits of the procedure and the sedation                            options and risks were discussed with the patient.                            All questions were answered, and informed consent                            was obtained. Prior Anticoagulants: The patient has                            taken no previous anticoagulant or antiplatelet                            agents. ASA Grade Assessment: II - A patient with                            mild systemic disease. After reviewing the risks                            and benefits, the patient was deemed in  satisfactory condition to undergo the procedure.                           After obtaining informed consent, the endoscope was                            passed under direct vision. Throughout the                            procedure, the patient's blood pressure, pulse, and                            oxygen saturations were monitored continuously. The                            Endoscope was introduced through the  mouth, and                            advanced to the second part of duodenum. The upper                            GI endoscopy was accomplished without difficulty.                            The patient tolerated the procedure well. Scope In: Scope Out: Findings:                 The esophagus was mildly torturous. Biopsies were                            not obtained from proximal/mid esophagus as                            previous bx were negative for EoE.                           One benign-appearing, intrinsic moderate                            (circumferential scarring or stenosis; an endoscope                            may pass) stenosis was found 35 cm from the                            incisors at GE junction. This stenosis measured 1.4                            cm (inner diameter) with surrounding mild                            esophagitis. Multiple biopsies were obtained and                            sent for histology.  The stenosis was traversed. The                            scope was withdrawn. Dilation was performed with a                            Maloney dilator with mild resistance at 50 Fr and                            52 Fr.                           A small hiatal hernia was present.                           Localized mild inflammation characterized by                            erythema was found in the gastric antrum. Biopsies                            were taken with a cold forceps for histology.                           A few 4 to 6 mm semi-sessile polyps with no                            stigmata of recent bleeding were found in the                            gastric fundus and in the gastric body. Biopsies                            were taken with a cold forceps for histology.                           The examined duodenum was normal. Biopsies for                            histology were taken with a cold forceps for                             evaluation of celiac disease. Complications:            No immediate complications. Estimated Blood Loss:     Estimated blood loss: none. Impression:               - Benign-appearing esophageal stenosis with                            surrounding esophagitis. Biopsied and dilated.                           - Small hiatal hernia.                           -  Gastritis. Biopsied.                           - A few gastric polyps. Biopsied.                           - Normal examined duodenum. Biopsied. Recommendation:           - Patient has a contact number available for                            emergencies. The signs and symptoms of potential                            delayed complications were discussed with the                            patient. Return to normal activities tomorrow.                            Written discharge instructions were provided to the                            patient.                           - Post dilatation diet.                           - Patient took herself off Protonix (does not like                            to take meds). I believe, we have to go back on                            Protonix 40 mg PO QD #90, 4 refills                           - Await pathology results.                           - The findings and recommendations were discussed                            with the patient's family.                           - If continued problems, would consider esophageal                            manometry. Jackquline Denmark, MD 05/17/2021 10:14:53 AM This report has been signed electronically.

## 2021-05-17 NOTE — Progress Notes (Signed)
Chief Complaint: Dysphagia  Referring Provider:  Luetta Nutting, DO      ASSESSMENT AND PLAN;   #1. GERD with small HH  #2. Eso dysphagia. H/O eso stricture 2018.  Most recent EGD 01/2020 did not reveal any stricture  Plan: -Ba swallow with barium tablet -Hold off on PPIs (pt preference) -Instructed to chew food specially meats and breads well and slowly. -EGD with dil if needed (would prefer Jan 2022)  HPI:    Gwendolyn Fleming is a 55 y.o. female  With DM2, non-celiac gluten sensitivity, HTN, HLD, peripheral neuropathy who does not like to take medications and prefers natural therapy including essential oils.  C/O dysphagia - intermittent, more with rice, worsening x 2-3 months, mostly in the mid chest.  No associated heartburn.  An EGD done previously as below showing small hiatal hernia.  She also had esophageal dilatation in April 2018.  She was given a trial of Protonix which worked good.  However, she weaned herself off Protonix and is currently using essential oils.  No further heartburn.  Note that she had diarrhea with omeprazole.  She denies having any odynophagia or dysphagia.  No melena or hematochezia.  No unintentional weight loss.  She has normal bowel movements.  Used to be more constipated before gallbladder surgery.  No sodas, chocolates, chewing gums, artificial sweeteners and candy. No NSAIDs   Past GI procedures:  EGD @[place]  by Dr Jeris Penta 01/28/2020 -Small hiatal hernia -No strictures. Eso Bx- neg for EOE or Barrett's. Eso bx did show acute and chronic inflammation. -Several 2 to 4 mm gastric polyps. Bx-fundic gland polyps  EGD 10/2016 -Small hiatal hernia -GE junction stricture s/p dil 22mm  Screening colonoscopy 10/17/2016: Nl. Rpt in 10 yrs  Past Medical History:  Diagnosis Date   GERD (gastroesophageal reflux disease)    Hiatal hernia    Hyperlipemia    Hypertension    Peripheral neuropathy    Skin carcinoma    Type II diabetes mellitus  ([place])    Ulnar nerve damage    bilateral    Past Surgical History:  Procedure Laterality Date   ABDOMINAL HYSTERECTOMY     AUGMENTATION MAMMAPLASTY     BREAST REDUCTION SURGERY Bilateral 2001   CESAREAN SECTION     CHOLECYSTECTOMY     COLONOSCOPY  10/17/2016   [facility]. Normal colonoscopy. In Epic   ESOPHAGOGASTRODUODENOSCOPY  10/17/2016   [place]. Normal upper endoscopy except a small hiatal hernia was observed. 63mm stricture was noted at esophago-gastric junction. Mucosa inside the stricture appear normal. Endoscope passed through. Multiple biopsies were taken. Dilitation: esophagus 37cm from the incisors up to 80mm   ESOPHAGOGASTRODUODENOSCOPY  01/28/2020   Small hiatal hernia.GE junction widely patent without stricture noted. s/p forcep biopsy taken from mid to distal esophagus. Esophagus appeared normal. Stomach body and fundus areas several small 2 to 4 mm polyps scattered in stomach body/fundus areas, probably benign fundic gland polyps; s/p sample forceps biopsy taken from stomach body area.Stomach appeared normal. Duodenum apperared normal.    Family History  Problem Relation Age of Onset   Skin cancer Mother    Irritable bowel syndrome Mother    Diabetes Father    Hypertension Father    Diabetes Brother    Diabetes Maternal Grandmother    Diverticulitis Maternal Grandmother    Breast cancer Cousin    Colon cancer Neg Hx    Rectal cancer Neg Hx    Pancreatic cancer Neg Hx  Esophageal cancer Neg Hx    Liver cancer Neg Hx    Stomach cancer Neg Hx     Social History   Tobacco Use   Smoking status: Never   Smokeless tobacco: Never  Vaping Use   Vaping Use: Never used  Substance Use Topics   Alcohol use: Never   Drug use: Never    Current Outpatient Medications  Medication Sig Dispense Refill   Alcohol Swabs (ALCOHOL WIPES) 70 % PADS Apply topically See admin instructions.     atorvastatin (LIPITOR)  10 MG tablet Take 1 tablet (10 mg total) by mouth daily. 90 tablet 1   empagliflozin (JARDIANCE) 25 MG TABS tablet Take 1 tablet (25 mg total) by mouth daily. 90 tablet 3   glucose blood (ONETOUCH VERIO) test strip Check your blood sugar 2 times a day (Every morning before breakfast and every evening before dinner)     Insulin Glargine (BASAGLAR KWIKPEN) 100 UNIT/ML Inject 42 Units into the skin daily. 15 mL 2   Insulin Pen Needle (NOVOFINE PEN NEEDLE) 32G X 6 MM MISC Use to inject insulin 100 each 0   Lancets (ONETOUCH DELICA PLUS [id number]) MISC Use as directed to test blood sugar. For use with Delica Lancing device     losartan (COZAAR) 100 MG tablet Take 1 tablet (100 mg total) by mouth daily. 90 tablet 1   Current Facility-Administered Medications  Medication Dose Route Frequency Provider Last Rate Last Admin   0.9 %  sodium chloride infusion  500 mL Intravenous Once Jackquline Denmark, MD        Allergies  Allergen Reactions   Penicillins    Cephalosporins    Omeprazole Diarrhea   Metformin Nausea And Vomiting    Upset stomach   Other Rash    General Anesthesia   Tape Rash    Review of Systems:  Constitutional: Denies fever, chills, diaphoresis, appetite change and fatigue.  HEENT: Denies photophobia, eye pain, redness, hearing loss, ear pain, congestion, sore throat, rhinorrhea, sneezing, mouth sores, neck pain, neck stiffness and tinnitus.   Respiratory: Denies SOB, DOE, cough, chest tightness,  and wheezing.   Cardiovascular: Denies chest pain, palpitations and leg swelling.  Genitourinary: Denies dysuria, urgency, frequency, hematuria, flank pain and difficulty urinating.  Musculoskeletal: Denies myalgias, back pain, joint swelling, arthralgias and gait problem.  Skin: No rash.  Neurological: Denies dizziness, seizures, syncope, weakness, light-headedness, numbness and headaches.  Hematological: Denies adenopathy. Easy bruising, personal or family bleeding history   Psychiatric/Behavioral: No anxiety or depression     Physical Exam:    BP 130/75   Pulse 69   Temp (!) 97.3 F (36.3 C)   Resp 13   Ht 6' (1.829 m)   Wt 226 lb (102.5 kg)   SpO2 99%   BMI 30.65 kg/m  Wt Readings from Last 3 Encounters:  05/17/21 226 lb (102.5 kg)  04/14/21 226 lb 8 oz (102.7 kg)  02/25/21 230 lb (104.3 kg)   Constitutional:  Well-developed, in no acute distress. Psychiatric: Normal mood and affect. Behavior is normal. HEENT: Pupils normal.  Conjunctivae are normal. No scleral icterus. Cardiovascular: Normal rate, regular rhythm. No edema Pulmonary/chest: Effort normal and breath sounds normal. No wheezing, rales or rhonchi. Abdominal: Soft, nondistended. Nontender. Bowel sounds active throughout. There are no masses palpable. No hepatomegaly. Rectal: Deferred Neurological: Alert and oriented to person place and time. Skin: Skin is warm and dry. No rashes noted.  Data Reviewed: I have personally reviewed following labs and imaging  studies  CBC: CBC Latest Ref Rng & Units 02/25/2021  WBC 3.8 - 10.8 Thousand/uL 8.7  Hemoglobin 11.7 - 15.5 g/dL 14.2  Hematocrit 35.0 - 45.0 % 42.2  Platelets 140 - 400 Thousand/uL 271    CMP: CMP Latest Ref Rng & Units 02/25/2021  Glucose 65 - 99 mg/dL 119(H)  BUN 7 - 25 mg/dL 12  Creatinine 0.50 - 1.03 mg/dL 0.75  Sodium 135 - 146 mmol/L 139  Potassium 3.5 - 5.3 mmol/L 4.7  Chloride 98 - 110 mmol/L 102  CO2 20 - 32 mmol/L 29  Calcium 8.6 - 10.4 mg/dL 9.4  Total Protein 6.1 - 8.1 g/dL 6.9  Total Bilirubin 0.2 - 1.2 mg/dL 0.3  AST 10 - 35 U/L 15  ALT 6 - 29 U/L 20       Carmell Austria, MD 05/17/2021, 9:50 AM  Cc: Luetta Nutting, DO

## 2021-05-17 NOTE — Patient Instructions (Signed)
Please read handouts provided. Continue present medications. Await pathology results. Post Dilation Diet. Begin Protonix 40 mg daily.   YOU HAD AN ENDOSCOPIC PROCEDURE TODAY AT [facility]:   Refer to the procedure report that was given to you for any specific questions about what was found during the examination.  If the procedure report does not answer your questions, please call your gastroenterologist to clarify.  If you requested that your care partner not be given the details of your procedure findings, then the procedure report has been included in a sealed envelope for you to review at your convenience later.  YOU SHOULD EXPECT: Some feelings of bloating in the abdomen. Passage of more gas than usual.  Walking can help get rid of the air that was put into your GI tract during the procedure and reduce the bloating. If you had a lower endoscopy (such as a colonoscopy or flexible sigmoidoscopy) you may notice spotting of blood in your stool or on the toilet paper. If you underwent a bowel prep for your procedure, you may not have a normal bowel movement for a few days.  Please Note:  You might notice some irritation and congestion in your nose or some drainage.  This is from the oxygen used during your procedure.  There is no need for concern and it should clear up in a day or so.  SYMPTOMS TO REPORT IMMEDIATELY:    Following upper endoscopy (EGD)  Vomiting of blood or coffee ground material  New chest pain or pain under the shoulder blades  Painful or persistently difficult swallowing  New shortness of breath  Fever of 100F or higher  Black, tarry-looking stools  For urgent or emergent issues, a gastroenterologist can be reached at any hour by calling [phone]. Do not use MyChart messaging for urgent concerns.    DIET:  Drink plenty of fluids but you should avoid alcoholic beverages for 24 hours.  ACTIVITY:  You should plan to take it easy for the rest of  today and you should NOT DRIVE or use heavy machinery until tomorrow (because of the sedation medicines used during the test).    FOLLOW UP: Our staff will call the number listed on your records 48-72 hours following your procedure to check on you and address any questions or concerns that you may have regarding the information given to you following your procedure. If we do not reach you, we will leave a message.  We will attempt to reach you two times.  During this call, we will ask if you have developed any symptoms of COVID 19. If you develop any symptoms (ie: fever, flu-like symptoms, shortness of breath, cough etc.) before then, please call [phone].  If you test positive for Covid 19 in the 2 weeks post procedure, please call and report this information to [place].    If any biopsies were taken you will be contacted by phone or by letter within the next 1-3 weeks.  Please call us at [phone] if you have not heard about the biopsies in 3 weeks.    SIGNATURES/CONFIDENTIALITY: You and/or your care partner have signed paperwork which will be entered into your electronic medical record.  These signatures attest to the fact that that the information above on your After Visit Summary has been reviewed and is understood.  Full responsibility of the confidentiality of this discharge information lies with you and/or your care-partner.

## 2021-05-17 NOTE — Progress Notes (Signed)
Called to room to assist during endoscopic procedure.  Patient ID and intended procedure confirmed with present staff. Received instructions for my participation in the procedure from the performing physician.  

## 2021-05-17 NOTE — Progress Notes (Signed)
Report to PACU, RN, vss, BBS= Clear.  

## 2021-05-17 NOTE — Progress Notes (Signed)
C.W. vital signs. 

## 2021-05-18 ENCOUNTER — Other Ambulatory Visit: Payer: Self-pay

## 2021-05-19 ENCOUNTER — Telehealth: Payer: Self-pay | Admitting: *Deleted

## 2021-05-19 NOTE — Telephone Encounter (Signed)
  Follow up Call-  Call back number 05/17/2021  Post procedure Call Back phone  # [phone]  Permission to leave phone message Yes     Patient questions:  Do you have a fever, pain , or abdominal swelling? No. Pain Score  0 *  Have you tolerated food without any problems? Yes.    Have you been able to return to your normal activities? Yes.    Do you have any questions about your discharge instructions: Diet   No. Medications  No. Follow up visit  No.  Do you have questions or concerns about your Care? No.  Actions: * If pain score is 4 or above: No action needed, pain <4.  Have you developed a fever since your procedure? no  2.   Have you had an respiratory symptoms (SOB or cough) since your procedure? no  3.   Have you tested positive for COVID 19 since your procedure no  4.   Have you had any family members/close contacts diagnosed with the COVID 19 since your procedure?  no   If yes to any of these questions please route to Joylene John, RN and Joella Prince, RN

## 2021-05-23 ENCOUNTER — Encounter: Payer: Self-pay | Admitting: Gastroenterology

## 2021-05-24 ENCOUNTER — Encounter: Payer: Self-pay | Admitting: Family Medicine

## 2021-05-24 MED ORDER — ONETOUCH VERIO W/DEVICE KIT: PACK | Status: DC

## 2021-05-24 MED ORDER — LOSARTAN POTASSIUM 100 MG PO TABS: 100.0000 mg | ORAL_TABLET | Freq: Every day | ORAL | 1 refills | Status: DC

## 2021-05-25 MED ORDER — ONETOUCH VERIO W/DEVICE KIT: PACK | 99 refills | Status: DC

## 2021-05-25 MED ORDER — ONETOUCH VERIO W/DEVICE KIT: PACK | Status: DC

## 2021-05-25 NOTE — Addendum Note (Signed)
Addended by: Narda Rutherford on: 05/25/2021 03:35 PM   Modules accepted: Orders

## 2021-07-29 ENCOUNTER — Telehealth: Payer: Self-pay

## 2021-07-29 NOTE — Telephone Encounter (Signed)
Pt lvm stating she's experienced death in the family in [place]. Needs medication refills.   Returned pt's call. Requested callback or MyChart message with rx refills needed and pharmacy name & address.

## 2021-08-30 ENCOUNTER — Ambulatory Visit: Payer: Managed Care, Other (non HMO) | Admitting: Family Medicine

## 2021-08-30 ENCOUNTER — Encounter: Payer: Self-pay | Admitting: Family Medicine

## 2021-08-30 ENCOUNTER — Other Ambulatory Visit: Payer: Self-pay

## 2021-08-30 VITALS — BP 138/82 | HR 64 | Wt 228.1 lb

## 2021-08-30 DIAGNOSIS — E1169 Type 2 diabetes mellitus with other specified complication: Secondary | ICD-10-CM | POA: Diagnosis not present

## 2021-08-30 DIAGNOSIS — Z794 Long term (current) use of insulin: Secondary | ICD-10-CM | POA: Diagnosis not present

## 2021-08-30 DIAGNOSIS — I10 Essential (primary) hypertension: Secondary | ICD-10-CM | POA: Diagnosis not present

## 2021-08-30 DIAGNOSIS — B351 Tinea unguium: Secondary | ICD-10-CM | POA: Insufficient documentation

## 2021-08-30 DIAGNOSIS — D485 Neoplasm of uncertain behavior of skin: Secondary | ICD-10-CM | POA: Diagnosis not present

## 2021-08-30 DIAGNOSIS — E1142 Type 2 diabetes mellitus with diabetic polyneuropathy: Secondary | ICD-10-CM | POA: Diagnosis not present

## 2021-08-30 DIAGNOSIS — E785 Hyperlipidemia, unspecified: Secondary | ICD-10-CM

## 2021-08-30 LAB — POCT GLYCOSYLATED HEMOGLOBIN (HGB A1C): Hemoglobin A1C: 6.9 % — AB (ref 4.0–5.6)

## 2021-08-30 MED ORDER — TERBINAFINE HCL 250 MG PO TABS: 250.0000 mg | ORAL_TABLET | Freq: Every day | ORAL | 0 refills | Status: AC

## 2021-08-30 MED ORDER — ATORVASTATIN CALCIUM 10 MG PO TABS: 10.0000 mg | ORAL_TABLET | Freq: Every day | ORAL | 3 refills | Status: DC

## 2021-08-30 MED ORDER — BASAGLAR KWIKPEN 100 UNIT/ML ~~LOC~~ SOPN: 45.0000 [IU] | PEN_INJECTOR | Freq: Every day | SUBCUTANEOUS | 3 refills | Status: DC

## 2021-08-30 MED ORDER — LOSARTAN POTASSIUM 100 MG PO TABS: 100.0000 mg | ORAL_TABLET | Freq: Every day | ORAL | 3 refills | Status: DC

## 2021-08-30 MED ORDER — EMPAGLIFLOZIN 25 MG PO TABS: 25.0000 mg | ORAL_TABLET | Freq: Every day | ORAL | 3 refills | Status: DC

## 2021-08-30 NOTE — Patient Instructions (Addendum)
Increase basaglar slightly to 45 units daily.  Add terbinafine for toenail fungus.  'Have labs in 6 weeks.  Follow up in 6 months.

## 2021-08-30 NOTE — Assessment & Plan Note (Signed)
Slight worsening of blood sugar since last visit.  Increasing insulin to 45 units daily.  Continue to work on dietary and lifestyle change.

## 2021-08-30 NOTE — Assessment & Plan Note (Signed)
Tolerating atorvastatin well at current strength.   

## 2021-08-30 NOTE — Progress Notes (Signed)
Gwendolyn Fleming - 56 y.o. female MRN [id number]  Date of birth: 04/22/1966  Subjective No chief complaint on file.   HPI Gwendolyn Fleming is a 55 year old female here today for follow-up visit.  Reports that she feels well today.  She does have some skin lesions she would like evaluated today.  She is using insulin as directed and taking Jardiance daily.  Tolerating these well.  Denies any symptoms of hypoglycemia.  Tolerating atorvastatin well for associated hyperlipidemia.  Blood pressure has remained well controlled with losartan.  Denies chest pain, shortness of breath, palpitations, headache or vision changes.  She has noted thickened, dystrophic nails.  She has had nail loss on the left foot.   ROS:  A comprehensive ROS was completed and negative except as noted per HPI  Allergies  Allergen Reactions   Penicillins    Cephalosporins    Omeprazole Diarrhea   Metformin Nausea And Vomiting    Upset stomach   Other Rash    General Anesthesia   Tape Rash    Past Medical History:  Diagnosis Date   GERD (gastroesophageal reflux disease)    Hiatal hernia    Hyperlipemia    Hypertension    Peripheral neuropathy    Skin carcinoma    Type II diabetes mellitus ([place])    Ulnar nerve damage    bilateral    Past Surgical History:  Procedure Laterality Date   ABDOMINAL HYSTERECTOMY     AUGMENTATION MAMMAPLASTY     BREAST REDUCTION SURGERY Bilateral 2001   CESAREAN SECTION     CHOLECYSTECTOMY     COLONOSCOPY  10/17/2016   [facility]. Normal colonoscopy. In Epic   ESOPHAGOGASTRODUODENOSCOPY  10/17/2016   [place]. Normal upper endoscopy except a small hiatal hernia was observed. 55mm stricture was noted at esophago-gastric junction. Mucosa inside the stricture appear normal. Endoscope passed through. Multiple biopsies were taken. Dilitation: esophagus 37cm from the incisors up to 62mm   ESOPHAGOGASTRODUODENOSCOPY  01/28/2020   Small  hiatal hernia.GE junction widely patent without stricture noted. s/p forcep biopsy taken from mid to distal esophagus. Esophagus appeared normal. Stomach body and fundus areas several small 2 to 4 mm polyps scattered in stomach body/fundus areas, probably benign fundic gland polyps; s/p sample forceps biopsy taken from stomach body area.Stomach appeared normal. Duodenum apperared normal.    Social History   Socioeconomic History   Marital status: Married    Spouse name: Gwendolyn Fleming   Number of children: 2   Years of education: Not on file   Highest education level: Not on file  Occupational History   Occupation: Ambulance person  Tobacco Use   Smoking status: Never   Smokeless tobacco: Never  Vaping Use   Vaping Use: Never used  Substance and Sexual Activity   Alcohol use: Never   Drug use: Never   Sexual activity: Not Currently    Partners: Male  Other Topics Concern   Not on file  Social History Narrative   Not on file   Social Determinants of Health   Financial Resource Strain: Not on file  Food Insecurity: Not on file  Transportation Needs: Not on file  Physical Activity: Not on file  Stress: Not on file  Social Connections: Not on file    Family History  Problem Relation Age of Onset   Skin cancer Mother    Irritable bowel syndrome Mother    Diabetes Father    Hypertension Father    Diabetes Brother  Diabetes Maternal Grandmother    Diverticulitis Maternal Grandmother    Breast cancer Cousin    Colon cancer Neg Hx    Rectal cancer Neg Hx    Pancreatic cancer Neg Hx    Esophageal cancer Neg Hx    Liver cancer Neg Hx    Stomach cancer Neg Hx     Health Maintenance  Topic Date Due   COVID-19 Vaccine (1) Never done   HIV Screening  Never done   Hepatitis C Screening  Never done   Zoster Vaccines- Shingrix (1 of 2) Never done   INFLUENZA VACCINE  Never done   OPHTHALMOLOGY EXAM  01/09/2022   FOOT EXAM  02/25/2022   HEMOGLOBIN A1C  02/27/2022    MAMMOGRAM  12/18/2022   COLONOSCOPY (Pts 45-74yrs Insurance coverage will need to be confirmed)  10/18/2026   TETANUS/TDAP  08/01/2030   HPV VACCINES  Aged Out   PAP SMEAR-Modifier  Discontinued     ----------------------------------------------------------------------------------------------------------------------------------------------------------------------------------------------------------------- Physical Exam BP 138/82    Pulse 64    Wt 228 lb 1.3 oz (103.5 kg)    SpO2 99%    BMI 30.93 kg/m   Physical Exam Constitutional:      Appearance: Normal appearance.  Cardiovascular:     Rate and Rhythm: Normal rate and regular rhythm.  Pulmonary:     Effort: Pulmonary effort is normal.     Breath sounds: Normal breath sounds.  Musculoskeletal:     Cervical back: Neck supple.  Skin:    Comments: Verrucous lesion to the tragus of the left ear. Multiple slightly raised, pigmented, scaly lesions noted along the back.  Please have stuck on appearance.  Neurological:     Mental Status: She is alert.  Psychiatric:        Mood and Affect: Mood normal.        Behavior: Behavior normal.    ------------------------------------------------------------------------------------------------------------------------------------------------------------------------------------------------------------------- Assessment and Plan  HTN (hypertension) Blood pressure remains fairly well controlled.  Recommend continuation of losartan at current strength.  Type 2 diabetes mellitus with diabetic polyneuropathy (HCC) Slight worsening of blood sugar since last visit.  Increasing insulin to 45 units daily.  Continue to work on dietary and lifestyle change.  Hyperlipidemia associated with type 2 diabetes mellitus ([place]) Tolerating atorvastatin well at current strength.  Neoplasm of uncertain behavior of skin She has multiple areas on her back that are consistent with seborrheic keratosis.  She does  have verrucous lesion on the tragus of the left ear.  She would like to see dermatology for these.  Referral entered.  Onychomycosis Starting course of terbinafine x12 weeks.  We will recheck LFTs approximately 6 weeks after starting this.   Meds ordered this encounter  Medications   atorvastatin (LIPITOR) 10 MG tablet    Sig: Take 1 tablet (10 mg total) by mouth daily.    Dispense:  90 tablet    Refill:  3   empagliflozin (JARDIANCE) 25 MG TABS tablet    Sig: Take 1 tablet (25 mg total) by mouth daily.    Dispense:  90 tablet    Refill:  3   Insulin Glargine (BASAGLAR KWIKPEN) 100 UNIT/ML    Sig: Inject 45 Units into the skin daily. May use up to max 60 units per day    Dispense:  40 mL    Refill:  3   losartan (COZAAR) 100 MG tablet    Sig: Take 1 tablet (100 mg total) by mouth daily.    Dispense:  90 tablet  Refill:  3   terbinafine (LAMISIL) 250 MG tablet    Sig: Take 1 tablet (250 mg total) by mouth daily.    Dispense:  84 tablet    Refill:  0    Return in about 6 months (around 02/27/2022) for DM/FBW.    This visit occurred during the SARS-CoV-2 public health emergency.  Safety protocols were in place, including screening questions prior to the visit, additional usage of staff PPE, and extensive cleaning of exam room while observing appropriate contact time as indicated for disinfecting solutions.

## 2021-08-30 NOTE — Assessment & Plan Note (Signed)
She has multiple areas on her back that are consistent with seborrheic keratosis.  She does have verrucous lesion on the tragus of the left ear.  She would like to see dermatology for these.  Referral entered.

## 2021-08-30 NOTE — Assessment & Plan Note (Signed)
Blood pressure remains fairly well controlled.  Recommend continuation of losartan at current strength.

## 2021-08-30 NOTE — Assessment & Plan Note (Signed)
Starting course of terbinafine x12 weeks.  We will recheck LFTs approximately 6 weeks after starting this.

## 2021-10-16 LAB — HEPATIC FUNCTION PANEL
AG Ratio: 1.7 (calc) (ref 1.0–2.5)
ALT: 26 U/L (ref 6–29)
AST: 20 U/L (ref 10–35)
Albumin: 4.3 g/dL (ref 3.6–5.1)
Alkaline phosphatase (APISO): 86 U/L (ref 37–153)
Bilirubin, Direct: 0.1 mg/dL (ref 0.0–0.2)
Globulin: 2.6 g/dL (calc) (ref 1.9–3.7)
Indirect Bilirubin: 0.4 mg/dL (calc) (ref 0.2–1.2)
Total Bilirubin: 0.5 mg/dL (ref 0.2–1.2)
Total Protein: 6.9 g/dL (ref 6.1–8.1)

## 2021-12-06 ENCOUNTER — Encounter: Payer: Self-pay | Admitting: Family Medicine

## 2022-02-16 ENCOUNTER — Encounter: Payer: Self-pay | Admitting: Family Medicine

## 2022-02-16 MED ORDER — PANTOPRAZOLE SODIUM 40 MG PO TBEC: 40.0000 mg | DELAYED_RELEASE_TABLET | Freq: Every day | ORAL | 4 refills | Status: DC

## 2022-02-28 ENCOUNTER — Ambulatory Visit: Payer: Managed Care, Other (non HMO) | Admitting: Family Medicine

## 2022-02-28 ENCOUNTER — Encounter: Payer: Self-pay | Admitting: Family Medicine

## 2022-02-28 VITALS — BP 138/79 | HR 66 | Resp 20 | Ht 72.0 in | Wt 229.1 lb

## 2022-02-28 DIAGNOSIS — Z794 Long term (current) use of insulin: Secondary | ICD-10-CM | POA: Diagnosis not present

## 2022-02-28 DIAGNOSIS — E1169 Type 2 diabetes mellitus with other specified complication: Secondary | ICD-10-CM

## 2022-02-28 DIAGNOSIS — E785 Hyperlipidemia, unspecified: Secondary | ICD-10-CM

## 2022-02-28 DIAGNOSIS — E1142 Type 2 diabetes mellitus with diabetic polyneuropathy: Secondary | ICD-10-CM

## 2022-02-28 DIAGNOSIS — I10 Essential (primary) hypertension: Secondary | ICD-10-CM | POA: Diagnosis not present

## 2022-02-28 LAB — POCT GLYCOSYLATED HEMOGLOBIN (HGB A1C): HbA1c, POC (controlled diabetic range): 7.1 % — AB (ref 0.0–7.0)

## 2022-02-28 MED ORDER — EMPAGLIFLOZIN 25 MG PO TABS: 25.0000 mg | ORAL_TABLET | Freq: Every day | ORAL | 3 refills | Status: DC

## 2022-02-28 NOTE — Patient Instructions (Addendum)
I would recommend adding ozempic to help with blood sugar and weight management.   Let me know if interested in trying.   Continue all other medications.   See me again in 6 months.

## 2022-03-07 NOTE — Assessment & Plan Note (Signed)
Tolerating atorvastatin well.  Continue at current strength.  

## 2022-03-07 NOTE — Progress Notes (Signed)
Gwendolyn Fleming - 56 y.o. female MRN [id number]  Date of birth: 1966-01-04  Subjective Chief Complaint  Patient presents with   Diabetes   Follow-up    HPI Gwendolyn Fleming is a 56 year old female here today for follow-up visit.  Reports she is feeling well at this time.  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  Tolerating current medication well.  She has not really made any significant changes to her diet or exercise patterns.  A1c today is slightly elevated compared to previous reading.  Tolerating atorvastatin well for management of hyperlipidemia.  No side effects at this time.  Blood pressures remained fairly well controlled with losartan.  No side effects at current strength.   ROS:  A comprehensive ROS was completed and negative except as noted per HPI   Allergies  Allergen Reactions   Penicillins    Cephalosporins    Omeprazole Diarrhea   Metformin Nausea And Vomiting    Upset stomach   Other Rash    General Anesthesia   Tape Rash    Past Medical History:  Diagnosis Date   GERD (gastroesophageal reflux disease)    Hiatal hernia    Hyperlipemia    Hypertension    Peripheral neuropathy    Skin carcinoma    Type II diabetes mellitus ([place])    Ulnar nerve damage    bilateral    Past Surgical History:  Procedure Laterality Date   ABDOMINAL HYSTERECTOMY     AUGMENTATION MAMMAPLASTY     BREAST REDUCTION SURGERY Bilateral 2001   CESAREAN SECTION     CHOLECYSTECTOMY     COLONOSCOPY  10/17/2016   [facility]. Normal colonoscopy. In Epic   ESOPHAGOGASTRODUODENOSCOPY  10/17/2016   [place]. Normal upper endoscopy except a small hiatal hernia was observed. 69m stricture was noted at esophago-gastric junction. Mucosa inside the stricture appear normal. Endoscope passed through. Multiple biopsies were taken. Dilitation: esophagus 37cm from the incisors up to 249m  ESOPHAGOGASTRODUODENOSCOPY   01/28/2020   Small hiatal hernia.GE junction widely patent without stricture noted. s/p forcep biopsy taken from mid to distal esophagus. Esophagus appeared normal. Stomach body and fundus areas several small 2 to 4 mm polyps scattered in stomach body/fundus areas, probably benign fundic gland polyps; s/p sample forceps biopsy taken from stomach body area.Stomach appeared normal. Duodenum apperared normal.    Social History   Socioeconomic History   Marital status: Married    Spouse name: Gwendolyn Fleming Number of children: 2   Years of education: Not on file   Highest education level: Not on file  Occupational History   Occupation: CrAmbulance personTobacco Use   Smoking status: Never   Smokeless tobacco: Never  Vaping Use   Vaping Use: Never used  Substance and Sexual Activity   Alcohol use: Never   Drug use: Never   Sexual activity: Not Currently    Partners: Male  Other Topics Concern   Not on file  Social History Narrative   Not on file   Social Determinants of Health   Financial Resource Strain: Not on file  Food Insecurity: Not on file  Transportation Needs: Not on file  Physical Activity: Not on file  Stress: Not on file  Social Connections: Not on file    Family History  Problem Relation Age of Onset   Skin cancer Mother    Irritable bowel syndrome Mother    Diabetes Father    Hypertension Father  Diabetes Brother    Diabetes Maternal Grandmother    Diverticulitis Maternal Grandmother    Breast cancer Cousin    Colon cancer Neg Hx    Rectal cancer Neg Hx    Pancreatic cancer Neg Hx    Esophageal cancer Neg Hx    Liver cancer Neg Hx    Stomach cancer Neg Hx     Health Maintenance  Topic Date Due   HIV Screening  Never done   Hepatitis C Screening  Never done   Diabetic kidney evaluation - Urine ACR  05/03/2020   Diabetic kidney evaluation - GFR measurement  02/25/2022   FOOT EXAM  02/25/2022   HEMOGLOBIN A1C  08/31/2022   MAMMOGRAM   12/18/2022   OPHTHALMOLOGY EXAM  02/06/2023   COLONOSCOPY (Pts 45-5yr Insurance coverage will need to be confirmed)  10/18/2026   TETANUS/TDAP  08/01/2030   HPV VACCINES  Aged Out   INFLUENZA VACCINE  Discontinued   PAP SMEAR-Modifier  Discontinued   COVID-19 Vaccine  Discontinued   Zoster Vaccines- Shingrix  Discontinued     ----------------------------------------------------------------------------------------------------------------------------------------------------------------------------------------------------------------- Physical Exam BP 138/79   Pulse 66   Resp 20   Ht 6' (1.829 m)   Wt 229 lb 1.3 oz (103.9 kg)   SpO2 97%   BMI 31.07 kg/m   Physical Exam Constitutional:      Appearance: Normal appearance.  Eyes:     General: No scleral icterus. Cardiovascular:     Rate and Rhythm: Normal rate and regular rhythm.  Pulmonary:     Effort: Pulmonary effort is normal.     Breath sounds: Normal breath sounds.  Musculoskeletal:     Cervical back: Neck supple.  Neurological:     Mental Status: She is alert.  Psychiatric:        Mood and Affect: Mood normal.        Behavior: Behavior normal.     ------------------------------------------------------------------------------------------------------------------------------------------------------------------------------------------------------------------- Assessment and Plan  HTN (hypertension) Blood pressure is fairly well controlled at this time.  Continue losartan at current strength.  Type 2 diabetes mellitus with diabetic polyneuropathy ([place] Diabetes control has worsened slightly since last visit.  Recommend addition of Ozempic as I think this would benefit her in regards to blood sugar control as well as weight management.  She will think about this and let me know if she would like to try.  Hyperlipidemia associated with type 2 diabetes mellitus ([place] Tolerating atorvastatin well.  Continue at current  strength.   Meds ordered this encounter  Medications   empagliflozin (JARDIANCE) 25 MG TABS tablet    Sig: Take 1 tablet (25 mg total) by mouth daily.    Dispense:  90 tablet    Refill:  3    Return in about 6 months (around 08/31/2022) for HTN/T2DM.    This visit occurred during the SARS-CoV-2 public health emergency.  Safety protocols were in place, including screening questions prior to the visit, additional usage of staff PPE, and extensive cleaning of exam room while observing appropriate contact time as indicated for disinfecting solutions.

## 2022-03-07 NOTE — Assessment & Plan Note (Signed)
Diabetes control has worsened slightly since last visit.  Recommend addition of Ozempic as I think this would benefit her in regards to blood sugar control as well as weight management.  She will think about this and let me know if she would like to try.

## 2022-03-07 NOTE — Assessment & Plan Note (Signed)
Blood pressure is fairly well controlled at this time.  Continue losartan at current strength.

## 2022-04-14 ENCOUNTER — Encounter: Payer: Self-pay | Admitting: Family Medicine

## 2022-06-20 ENCOUNTER — Other Ambulatory Visit: Payer: Self-pay | Admitting: Family Medicine

## 2022-07-26 DIAGNOSIS — M9905 Segmental and somatic dysfunction of pelvic region: Secondary | ICD-10-CM | POA: Diagnosis not present

## 2022-07-26 DIAGNOSIS — M47812 Spondylosis without myelopathy or radiculopathy, cervical region: Secondary | ICD-10-CM | POA: Diagnosis not present

## 2022-07-26 DIAGNOSIS — M9903 Segmental and somatic dysfunction of lumbar region: Secondary | ICD-10-CM | POA: Diagnosis not present

## 2022-07-26 DIAGNOSIS — M4726 Other spondylosis with radiculopathy, lumbar region: Secondary | ICD-10-CM | POA: Diagnosis not present

## 2022-08-09 DIAGNOSIS — M9905 Segmental and somatic dysfunction of pelvic region: Secondary | ICD-10-CM | POA: Diagnosis not present

## 2022-08-09 DIAGNOSIS — M4726 Other spondylosis with radiculopathy, lumbar region: Secondary | ICD-10-CM | POA: Diagnosis not present

## 2022-08-09 DIAGNOSIS — M47812 Spondylosis without myelopathy or radiculopathy, cervical region: Secondary | ICD-10-CM | POA: Diagnosis not present

## 2022-08-09 DIAGNOSIS — M9903 Segmental and somatic dysfunction of lumbar region: Secondary | ICD-10-CM | POA: Diagnosis not present

## 2022-08-23 DIAGNOSIS — M9903 Segmental and somatic dysfunction of lumbar region: Secondary | ICD-10-CM | POA: Diagnosis not present

## 2022-08-23 DIAGNOSIS — M47812 Spondylosis without myelopathy or radiculopathy, cervical region: Secondary | ICD-10-CM | POA: Diagnosis not present

## 2022-08-23 DIAGNOSIS — M9905 Segmental and somatic dysfunction of pelvic region: Secondary | ICD-10-CM | POA: Diagnosis not present

## 2022-08-23 DIAGNOSIS — M4726 Other spondylosis with radiculopathy, lumbar region: Secondary | ICD-10-CM | POA: Diagnosis not present

## 2022-09-05 ENCOUNTER — Encounter: Payer: Self-pay | Admitting: Family Medicine

## 2022-09-05 ENCOUNTER — Ambulatory Visit: Payer: BLUE CROSS/BLUE SHIELD | Admitting: Family Medicine

## 2022-09-05 VITALS — BP 117/79 | HR 105 | Ht 72.0 in | Wt 227.0 lb

## 2022-09-05 DIAGNOSIS — E785 Hyperlipidemia, unspecified: Secondary | ICD-10-CM | POA: Diagnosis not present

## 2022-09-05 DIAGNOSIS — E1142 Type 2 diabetes mellitus with diabetic polyneuropathy: Secondary | ICD-10-CM

## 2022-09-05 DIAGNOSIS — R1312 Dysphagia, oropharyngeal phase: Secondary | ICD-10-CM | POA: Diagnosis not present

## 2022-09-05 DIAGNOSIS — I10 Essential (primary) hypertension: Secondary | ICD-10-CM | POA: Diagnosis not present

## 2022-09-05 DIAGNOSIS — E1169 Type 2 diabetes mellitus with other specified complication: Secondary | ICD-10-CM | POA: Diagnosis not present

## 2022-09-05 DIAGNOSIS — R1319 Other dysphagia: Secondary | ICD-10-CM

## 2022-09-05 DIAGNOSIS — Z794 Long term (current) use of insulin: Secondary | ICD-10-CM | POA: Diagnosis not present

## 2022-09-05 LAB — POCT GLYCOSYLATED HEMOGLOBIN (HGB A1C): HbA1c, POC (controlled diabetic range): 7 % (ref 0.0–7.0)

## 2022-09-05 MED ORDER — TOUJEO SOLOSTAR 300 UNIT/ML ~~LOC~~ SOPN: 45.0000 [IU] | PEN_INJECTOR | Freq: Every day | SUBCUTANEOUS | 1 refills | Status: DC

## 2022-09-05 NOTE — Progress Notes (Signed)
[place] - 57 y.o. female MRN [id number]  Date of birth: 11-02-1965  Subjective No chief complaint on file.   HPI Gwendolyn Fleming is a 57 y.o. female here today for follow up visit.    She reports that she is doing pretty well.  Continues on basaglar and jardiance for management of diabetes.  Injecting  38 units of basaglar daily.  She is trying to stay moderately active.  She has  not really made any significant changes to her diet.   Remains on losartan for management of HTN.  Tolerating this well.  Denies chest pain, shortness of breath, palpitations, headache or vision changes.   Tolerating atorvastatin well for management of HLD.  Due for updated lipid panel.   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   ROS:  A comprehensive ROS was completed and negative except as noted per HPI  Allergies  Allergen Reactions   Penicillins    Cephalosporins    Omeprazole Diarrhea   Metformin Nausea And Vomiting    Upset stomach   Other Rash    General Anesthesia   Tape Rash    Past Medical History:  Diagnosis Date   GERD (gastroesophageal reflux disease)    Hiatal hernia    Hyperlipemia    Hypertension    Peripheral neuropathy    Skin carcinoma    Type II diabetes mellitus ([place])    Ulnar nerve damage    bilateral    Past Surgical History:  Procedure Laterality Date   ABDOMINAL HYSTERECTOMY     AUGMENTATION MAMMAPLASTY     BREAST REDUCTION SURGERY Bilateral 2001   CESAREAN SECTION     CHOLECYSTECTOMY     COLONOSCOPY  10/17/2016   [facility]. Normal colonoscopy. In Epic   ESOPHAGOGASTRODUODENOSCOPY  10/17/2016   [place]. Normal upper endoscopy except a small hiatal hernia was observed. 30m stricture was noted at esophago-gastric junction. Mucosa inside the stricture appear normal. Endoscope passed through. Multiple biopsies were taken. Dilitation: esophagus 37cm from the incisors up to 231m   ESOPHAGOGASTRODUODENOSCOPY  01/28/2020   Small hiatal hernia.GE junction widely patent without stricture noted. s/p forcep biopsy taken from mid to distal esophagus. Esophagus appeared normal. Stomach body and fundus areas several small 2 to 4 mm polyps scattered in stomach body/fundus areas, probably benign fundic gland polyps; s/p sample forceps biopsy taken from stomach body area.Stomach appeared normal. Duodenum apperared normal.    Social History   Socioeconomic History   Marital status: Married    Spouse name: TiAnalena Tapani Number of children: 2   Years of education: Not on file   Highest education level: Not on file  Occupational History   Occupation: CrAmbulance personTobacco Use   Smoking status: Never   Smokeless tobacco: Never  Vaping Use   Vaping Use: Never used  Substance and Sexual Activity   Alcohol use: Never   Drug use: Never   Sexual activity: Not Currently    Partners: Male  Other Topics Concern   Not on file  Social History Narrative   Not on file   Social Determinants of Health   Financial Resource Strain: Not on file  Food Insecurity: Not on file  Transportation Needs: Not on file  Physical Activity: Not on file  Stress: Not on file  Social Connections: Not on file    Family History  Problem Relation Age of Onset   Skin cancer Mother    Irritable bowel  syndrome Mother    Diabetes Father    Hypertension Father    Diabetes Brother    Diabetes Maternal Grandmother    Diverticulitis Maternal Grandmother    Breast cancer Cousin    Colon cancer Neg Hx    Rectal cancer Neg Hx    Pancreatic cancer Neg Hx    Esophageal cancer Neg Hx    Liver cancer Neg Hx    Stomach cancer Neg Hx     Health Maintenance  Topic Date Due   Diabetic kidney evaluation - eGFR measurement  02/25/2022   Diabetic kidney evaluation - Urine ACR  09/06/2022 (Originally 09/01/1981)   Hepatitis C Screening  09/05/2023 (Originally 09/01/1981)   HIV Screening  09/05/2023  (Originally 09/02/1978)   MAMMOGRAM  12/18/2022   OPHTHALMOLOGY EXAM  02/06/2023   HEMOGLOBIN A1C  03/08/2023   FOOT EXAM  09/05/2023   COLONOSCOPY (Pts 45-23yr Insurance coverage will need to be confirmed)  10/18/2026   DTaP/Tdap/Td (3 - Td or Tdap) 08/01/2030   HPV VACCINES  Aged Out   INFLUENZA VACCINE  Discontinued   PAP SMEAR-Modifier  Discontinued   COVID-19 Vaccine  Discontinued   Zoster Vaccines- Shingrix  Discontinued     ----------------------------------------------------------------------------------------------------------------------------------------------------------------------------------------------------------------- Physical Exam BP 117/79   Pulse (!) 105   Ht 6' (1.829 m)   Wt 227 lb (103 kg)   SpO2 98%   BMI 30.79 kg/m   Physical Exam Constitutional:      Appearance: Normal appearance.  HENT:     Head: Normocephalic and atraumatic.  Eyes:     General: No scleral icterus. Cardiovascular:     Rate and Rhythm: Normal rate and regular rhythm.  Pulmonary:     Effort: Pulmonary effort is normal.     Breath sounds: Normal breath sounds.  Musculoskeletal:     Cervical back: Neck supple.  Neurological:     Mental Status: She is alert.  Psychiatric:        Mood and Affect: Mood normal.        Behavior: Behavior normal.     ------------------------------------------------------------------------------------------------------------------------------------------------------------------------------------------------------------------- Assessment and Plan  HTN (hypertension) BP remains well controlled with losartan at current strength.    Type 2 diabetes mellitus with diabetic polyneuropathy ([place] Diabetes remains fairly well controlled.  Changing basaglar to toujeo at 45 units daily.  May titrate up to 60 units if needed. Follow up in 3 months.   Hyperlipidemia associated with type 2 diabetes mellitus ([place] Tolerating atoravastatin at current strength.   Update labs ordered.   Esophageal dysphagia Diagnosis of EOE previously.  Using PPI, however now having more oropharyngeal dysphagia.  Referral placed to ENT.    Meds ordered this encounter  Medications   insulin glargine, 1 Unit Dial, (TOUJEO SOLOSTAR) 300 UNIT/ML Solostar Pen    Sig: Inject 45 Units into the skin daily. May titrate up to 60 units per day based on home blood sugar readings    Dispense:  15 mL    Refill:  1    Return in about 6 months (around 03/08/2023) for HTN/T2DM.    This visit occurred during the SARS-CoV-2 public health emergency.  Safety protocols were in place, including screening questions prior to the visit, additional usage of staff PPE, and extensive cleaning of exam room while observing appropriate contact time as indicated for disinfecting solutions.

## 2022-09-05 NOTE — Assessment & Plan Note (Signed)
Diabetes remains fairly well controlled.  Changing basaglar to toujeo at 45 units daily.  May titrate up to 60 units if needed. Follow up in 3 months.

## 2022-09-05 NOTE — Assessment & Plan Note (Signed)
Diagnosis of EOE previously.  Using PPI, however now having more oropharyngeal dysphagia.  Referral placed to ENT.

## 2022-09-05 NOTE — Assessment & Plan Note (Signed)
Tolerating atoravastatin at current strength.  Update labs ordered.

## 2022-09-05 NOTE — Assessment & Plan Note (Signed)
BP remains well controlled with losartan at current strength.

## 2022-09-06 ENCOUNTER — Encounter: Payer: Self-pay | Admitting: Family Medicine

## 2022-09-06 ENCOUNTER — Ambulatory Visit: Payer: Managed Care, Other (non HMO) | Admitting: Family Medicine

## 2022-09-06 DIAGNOSIS — E1142 Type 2 diabetes mellitus with diabetic polyneuropathy: Secondary | ICD-10-CM

## 2022-09-06 DIAGNOSIS — M47812 Spondylosis without myelopathy or radiculopathy, cervical region: Secondary | ICD-10-CM | POA: Diagnosis not present

## 2022-09-06 DIAGNOSIS — M4726 Other spondylosis with radiculopathy, lumbar region: Secondary | ICD-10-CM | POA: Diagnosis not present

## 2022-09-06 DIAGNOSIS — M9905 Segmental and somatic dysfunction of pelvic region: Secondary | ICD-10-CM | POA: Diagnosis not present

## 2022-09-06 DIAGNOSIS — M9903 Segmental and somatic dysfunction of lumbar region: Secondary | ICD-10-CM | POA: Diagnosis not present

## 2022-09-06 LAB — CBC WITH DIFFERENTIAL/PLATELET
Absolute Monocytes: 461 cells/uL (ref 200–950)
Basophils Absolute: 32 cells/uL (ref 0–200)
Basophils Relative: 0.5 %
Eosinophils Absolute: 154 cells/uL (ref 15–500)
Eosinophils Relative: 2.4 %
HCT: 43.7 % (ref 35.0–45.0)
Hemoglobin: 14.6 g/dL (ref 11.7–15.5)
Lymphs Abs: 1600 cells/uL (ref 850–3900)
MCH: 30 pg (ref 27.0–33.0)
MCHC: 33.4 g/dL (ref 32.0–36.0)
MCV: 89.7 fL (ref 80.0–100.0)
MPV: 10.8 fL (ref 7.5–12.5)
Monocytes Relative: 7.2 %
Neutro Abs: 4154 cells/uL (ref 1500–7800)
Neutrophils Relative %: 64.9 %
Platelets: 234 10*3/uL (ref 140–400)
RBC: 4.87 10*6/uL (ref 3.80–5.10)
RDW: 12.5 % (ref 11.0–15.0)
Total Lymphocyte: 25 %
WBC: 6.4 10*3/uL (ref 3.8–10.8)

## 2022-09-06 LAB — COMPLETE METABOLIC PANEL WITH GFR
AG Ratio: 1.7 (calc) (ref 1.0–2.5)
ALT: 20 U/L (ref 6–29)
AST: 16 U/L (ref 10–35)
Albumin: 4.4 g/dL (ref 3.6–5.1)
Alkaline phosphatase (APISO): 86 U/L (ref 37–153)
BUN: 15 mg/dL (ref 7–25)
CO2: 31 mmol/L (ref 20–32)
Calcium: 9.8 mg/dL (ref 8.6–10.4)
Chloride: 106 mmol/L (ref 98–110)
Creat: 0.78 mg/dL (ref 0.50–1.03)
Globulin: 2.6 g/dL (calc) (ref 1.9–3.7)
Glucose, Bld: 154 mg/dL — ABNORMAL HIGH (ref 65–99)
Potassium: 5.2 mmol/L (ref 3.5–5.3)
Sodium: 144 mmol/L (ref 135–146)
Total Bilirubin: 0.4 mg/dL (ref 0.2–1.2)
Total Protein: 7 g/dL (ref 6.1–8.1)
eGFR: 87 mL/min/{1.73_m2} (ref >=60)

## 2022-09-06 LAB — LIPID PANEL W/REFLEX DIRECT LDL
Cholesterol: 167 mg/dL (ref <200)
HDL: 63 mg/dL (ref >=50)
LDL Cholesterol (Calc): 79 mg/dL (calc)
Non-HDL Cholesterol (Calc): 104 mg/dL (calc) (ref <130)
Total CHOL/HDL Ratio: 2.7 (calc) (ref <5.0)
Triglycerides: 145 mg/dL (ref <150)

## 2022-09-06 LAB — MICROALBUMIN / CREATININE URINE RATIO
Creatinine, Urine: 79 mg/dL (ref 20–275)
Microalb, Ur: <0.2 mg/dL

## 2022-09-06 MED ORDER — ACCU-CHEK GUIDE W/DEVICE KIT: PACK | 0 refills | Status: AC

## 2022-09-06 MED ORDER — ACCU-CHEK MULTICLIX LANCETS MISC: 12 refills | Status: AC

## 2022-09-06 MED ORDER — ACCU-CHEK GUIDE VI STRP: ORAL_STRIP | 12 refills | Status: AC

## 2022-09-08 ENCOUNTER — Other Ambulatory Visit: Payer: Self-pay | Admitting: Family Medicine

## 2022-09-14 ENCOUNTER — Encounter: Payer: Self-pay | Admitting: Family Medicine

## 2022-09-20 DIAGNOSIS — M47812 Spondylosis without myelopathy or radiculopathy, cervical region: Secondary | ICD-10-CM | POA: Diagnosis not present

## 2022-09-20 DIAGNOSIS — M9905 Segmental and somatic dysfunction of pelvic region: Secondary | ICD-10-CM | POA: Diagnosis not present

## 2022-09-20 DIAGNOSIS — M4726 Other spondylosis with radiculopathy, lumbar region: Secondary | ICD-10-CM | POA: Diagnosis not present

## 2022-09-20 DIAGNOSIS — M9903 Segmental and somatic dysfunction of lumbar region: Secondary | ICD-10-CM | POA: Diagnosis not present

## 2022-10-01 ENCOUNTER — Other Ambulatory Visit: Payer: Self-pay | Admitting: Family Medicine

## 2022-10-04 DIAGNOSIS — M9905 Segmental and somatic dysfunction of pelvic region: Secondary | ICD-10-CM | POA: Diagnosis not present

## 2022-10-04 DIAGNOSIS — M4726 Other spondylosis with radiculopathy, lumbar region: Secondary | ICD-10-CM | POA: Diagnosis not present

## 2022-10-04 DIAGNOSIS — M47812 Spondylosis without myelopathy or radiculopathy, cervical region: Secondary | ICD-10-CM | POA: Diagnosis not present

## 2022-10-04 DIAGNOSIS — M9903 Segmental and somatic dysfunction of lumbar region: Secondary | ICD-10-CM | POA: Diagnosis not present

## 2022-10-05 ENCOUNTER — Other Ambulatory Visit: Payer: Self-pay

## 2022-10-07 ENCOUNTER — Other Ambulatory Visit: Payer: Self-pay | Admitting: Family Medicine

## 2022-10-07 MED ORDER — NOVOFINE PEN NEEDLE 32G X 6 MM MISC: 3 refills | Status: AC

## 2022-10-07 NOTE — Progress Notes (Signed)
Updated rx sent

## 2022-10-11 DIAGNOSIS — R1310 Dysphagia, unspecified: Secondary | ICD-10-CM | POA: Diagnosis not present

## 2022-10-11 DIAGNOSIS — D8989 Other specified disorders involving the immune mechanism, not elsewhere classified: Secondary | ICD-10-CM | POA: Diagnosis not present

## 2022-10-11 DIAGNOSIS — J309 Allergic rhinitis, unspecified: Secondary | ICD-10-CM | POA: Diagnosis not present

## 2022-10-18 DIAGNOSIS — M4726 Other spondylosis with radiculopathy, lumbar region: Secondary | ICD-10-CM | POA: Diagnosis not present

## 2022-10-18 DIAGNOSIS — M9905 Segmental and somatic dysfunction of pelvic region: Secondary | ICD-10-CM | POA: Diagnosis not present

## 2022-10-18 DIAGNOSIS — M9903 Segmental and somatic dysfunction of lumbar region: Secondary | ICD-10-CM | POA: Diagnosis not present

## 2022-10-18 DIAGNOSIS — M47812 Spondylosis without myelopathy or radiculopathy, cervical region: Secondary | ICD-10-CM | POA: Diagnosis not present

## 2022-11-01 DIAGNOSIS — M9903 Segmental and somatic dysfunction of lumbar region: Secondary | ICD-10-CM | POA: Diagnosis not present

## 2022-11-01 DIAGNOSIS — M9905 Segmental and somatic dysfunction of pelvic region: Secondary | ICD-10-CM | POA: Diagnosis not present

## 2022-11-01 DIAGNOSIS — M4726 Other spondylosis with radiculopathy, lumbar region: Secondary | ICD-10-CM | POA: Diagnosis not present

## 2022-11-01 DIAGNOSIS — M47812 Spondylosis without myelopathy or radiculopathy, cervical region: Secondary | ICD-10-CM | POA: Diagnosis not present

## 2022-11-14 ENCOUNTER — Encounter: Payer: Self-pay | Admitting: Family Medicine

## 2022-11-14 MED ORDER — LOSARTAN POTASSIUM 100 MG PO TABS: 100.0000 mg | ORAL_TABLET | Freq: Every day | ORAL | 1 refills | Status: DC

## 2022-11-15 DIAGNOSIS — M9903 Segmental and somatic dysfunction of lumbar region: Secondary | ICD-10-CM | POA: Diagnosis not present

## 2022-11-15 DIAGNOSIS — M47812 Spondylosis without myelopathy or radiculopathy, cervical region: Secondary | ICD-10-CM | POA: Diagnosis not present

## 2022-11-15 DIAGNOSIS — M9905 Segmental and somatic dysfunction of pelvic region: Secondary | ICD-10-CM | POA: Diagnosis not present

## 2022-11-15 DIAGNOSIS — M4726 Other spondylosis with radiculopathy, lumbar region: Secondary | ICD-10-CM | POA: Diagnosis not present

## 2022-11-25 DIAGNOSIS — J301 Allergic rhinitis due to pollen: Secondary | ICD-10-CM | POA: Diagnosis not present

## 2022-11-29 DIAGNOSIS — M9905 Segmental and somatic dysfunction of pelvic region: Secondary | ICD-10-CM | POA: Diagnosis not present

## 2022-11-29 DIAGNOSIS — M4726 Other spondylosis with radiculopathy, lumbar region: Secondary | ICD-10-CM | POA: Diagnosis not present

## 2022-11-29 DIAGNOSIS — M9903 Segmental and somatic dysfunction of lumbar region: Secondary | ICD-10-CM | POA: Diagnosis not present

## 2022-11-29 DIAGNOSIS — M47812 Spondylosis without myelopathy or radiculopathy, cervical region: Secondary | ICD-10-CM | POA: Diagnosis not present

## 2022-12-07 DIAGNOSIS — J305 Allergic rhinitis due to food: Secondary | ICD-10-CM | POA: Diagnosis not present

## 2022-12-13 DIAGNOSIS — M4726 Other spondylosis with radiculopathy, lumbar region: Secondary | ICD-10-CM | POA: Diagnosis not present

## 2022-12-13 DIAGNOSIS — M9903 Segmental and somatic dysfunction of lumbar region: Secondary | ICD-10-CM | POA: Diagnosis not present

## 2022-12-13 DIAGNOSIS — M9905 Segmental and somatic dysfunction of pelvic region: Secondary | ICD-10-CM | POA: Diagnosis not present

## 2022-12-13 DIAGNOSIS — M47812 Spondylosis without myelopathy or radiculopathy, cervical region: Secondary | ICD-10-CM | POA: Diagnosis not present

## 2022-12-27 DIAGNOSIS — M9905 Segmental and somatic dysfunction of pelvic region: Secondary | ICD-10-CM | POA: Diagnosis not present

## 2022-12-27 DIAGNOSIS — M47812 Spondylosis without myelopathy or radiculopathy, cervical region: Secondary | ICD-10-CM | POA: Diagnosis not present

## 2022-12-27 DIAGNOSIS — M4726 Other spondylosis with radiculopathy, lumbar region: Secondary | ICD-10-CM | POA: Diagnosis not present

## 2022-12-27 DIAGNOSIS — M9903 Segmental and somatic dysfunction of lumbar region: Secondary | ICD-10-CM | POA: Diagnosis not present

## 2023-01-10 ENCOUNTER — Encounter: Payer: Self-pay | Admitting: Family Medicine

## 2023-01-10 DIAGNOSIS — M9903 Segmental and somatic dysfunction of lumbar region: Secondary | ICD-10-CM | POA: Diagnosis not present

## 2023-01-10 DIAGNOSIS — M4726 Other spondylosis with radiculopathy, lumbar region: Secondary | ICD-10-CM | POA: Diagnosis not present

## 2023-01-10 DIAGNOSIS — M47812 Spondylosis without myelopathy or radiculopathy, cervical region: Secondary | ICD-10-CM | POA: Diagnosis not present

## 2023-01-10 DIAGNOSIS — M9905 Segmental and somatic dysfunction of pelvic region: Secondary | ICD-10-CM | POA: Diagnosis not present

## 2023-01-12 MED ORDER — TRESIBA FLEXTOUCH 100 UNIT/ML ~~LOC~~ SOPN: PEN_INJECTOR | SUBCUTANEOUS | 3 refills | Status: DC

## 2023-03-08 ENCOUNTER — Ambulatory Visit: Payer: BLUE CROSS/BLUE SHIELD | Admitting: Family Medicine

## 2023-03-08 ENCOUNTER — Encounter: Payer: Self-pay | Admitting: Family Medicine

## 2023-03-08 VITALS — BP 138/80 | HR 62 | Ht 72.0 in | Wt 229.0 lb

## 2023-03-08 DIAGNOSIS — R1319 Other dysphagia: Secondary | ICD-10-CM | POA: Diagnosis not present

## 2023-03-08 DIAGNOSIS — E1169 Type 2 diabetes mellitus with other specified complication: Secondary | ICD-10-CM

## 2023-03-08 DIAGNOSIS — Z794 Long term (current) use of insulin: Secondary | ICD-10-CM

## 2023-03-08 DIAGNOSIS — E1142 Type 2 diabetes mellitus with diabetic polyneuropathy: Secondary | ICD-10-CM

## 2023-03-08 DIAGNOSIS — Z1231 Encounter for screening mammogram for malignant neoplasm of breast: Secondary | ICD-10-CM

## 2023-03-08 DIAGNOSIS — E785 Hyperlipidemia, unspecified: Secondary | ICD-10-CM

## 2023-03-08 DIAGNOSIS — I10 Essential (primary) hypertension: Secondary | ICD-10-CM

## 2023-03-08 LAB — POCT GLYCOSYLATED HEMOGLOBIN (HGB A1C): HbA1c, POC (controlled diabetic range): 7.4 % — AB (ref 0.0–7.0)

## 2023-03-08 MED ORDER — PANTOPRAZOLE SODIUM 40 MG PO TBEC: 40.0000 mg | DELAYED_RELEASE_TABLET | Freq: Every day | ORAL | 4 refills | Status: DC

## 2023-03-08 MED ORDER — LOSARTAN POTASSIUM 100 MG PO TABS
100.0000 mg | ORAL_TABLET | Freq: Every day | ORAL | 1 refills | Status: DC
Start: 2023-03-08 — End: 2023-10-27

## 2023-03-08 MED ORDER — EMPAGLIFLOZIN 25 MG PO TABS: 25.0000 mg | ORAL_TABLET | Freq: Every day | ORAL | 3 refills | Status: DC

## 2023-03-08 MED ORDER — TRESIBA FLEXTOUCH 100 UNIT/ML ~~LOC~~ SOPN: PEN_INJECTOR | SUBCUTANEOUS | 3 refills | Status: DC

## 2023-03-08 NOTE — Progress Notes (Signed)
Gwendolyn Fleming - 57 y.o. female MRN [id number]  Date of birth: 21-Sep-1965  Subjective Chief Complaint  Patient presents with   Hypertension   Diabetes    HPI  Gwendolyn Fleming is a 58 year old female here today for follow-up visit.  She reports she is doing pretty well.  Continues on combination of [place] for management of her diabetes.  She is doing well with these.  Denies hypoglycemia.  Blood sugars at home have been pretty well-controlled.  Weight is fairly stable.  She does have some nodules on her hands.  Denies Any preceding trauma triggering or contracture  Blood pressure remains well-controlled with losartan 100 mg daily.  She denies chest pain, shortness of breath, palpitations, headaches or vision changes.  Tolerating atorvastatin well for hyperlipidemia.  ROS:  A comprehensive ROS was completed and negative except as noted per HPI   Allergies  Allergen Reactions   Penicillins    Cephalosporins    Omeprazole Diarrhea   Metformin Nausea And Vomiting    Upset stomach   Other Rash    General Anesthesia   Tape Rash    Past Medical History:  Diagnosis Date   GERD (gastroesophageal reflux disease)    Hiatal hernia    Hyperlipemia    Hypertension    Peripheral neuropathy    Skin carcinoma    Type II diabetes mellitus (HCC)    Ulnar nerve damage    bilateral    Past Surgical History:  Procedure Laterality Date   ABDOMINAL HYSTERECTOMY     AUGMENTATION MAMMAPLASTY     BREAST REDUCTION SURGERY Bilateral 2001   CESAREAN SECTION     CHOLECYSTECTOMY     COLONOSCOPY  10/17/2016   [facility]. Normal colonoscopy. In Epic   ESOPHAGOGASTRODUODENOSCOPY  10/17/2016   [facility] [place]. Normal upper endoscopy except a small hiatal hernia was observed. 17mm stricture was noted at esophago-gastric junction. Mucosa inside the stricture appear normal. Endoscope passed through. Multiple biopsies were taken. Dilitation:  esophagus 37cm from the incisors up to 20mm   ESOPHAGOGASTRODUODENOSCOPY  01/28/2020   Small hiatal hernia.GE junction widely patent without stricture noted. s/p forcep biopsy taken from mid to distal esophagus. Esophagus appeared normal. Stomach body and fundus areas several small 2 to 4 mm polyps scattered in stomach body/fundus areas, probably benign fundic gland polyps; s/p sample forceps biopsy taken from stomach body area.Stomach appeared normal. Duodenum apperared normal.    Social History   Socioeconomic History   Marital status: Married    Spouse name: [name]   Number of children: 2   Years of education: Not on file   Highest education level: Not on file  Occupational History   Occupation: Scientist, clinical (histocompatibility and immunogenetics)  Tobacco Use   Smoking status: Never   Smokeless tobacco: Never  Vaping Use   Vaping status: Never Used  Substance and Sexual Activity   Alcohol use: Never   Drug use: Never   Sexual activity: Not Currently    Partners: Male  Other Topics Concern   Not on file  Social History Narrative   Not on file   Social Determinants of Health   Financial Resource Strain: Not on file  Food Insecurity: Not on file  Transportation Needs: Not on file  Physical Activity: Not on file  Stress: Not on file  Social Connections: Not on file    Family History  Problem Relation Age of Onset   Skin cancer Mother    Irritable bowel syndrome  Mother    Diabetes Father    Hypertension Father    Diabetes Brother    Diabetes Maternal Grandmother    Diverticulitis Maternal Grandmother    Breast cancer Cousin    Colon cancer Neg Hx    Rectal cancer Neg Hx    Pancreatic cancer Neg Hx    Esophageal cancer Neg Hx    Liver cancer Neg Hx    Stomach cancer Neg Hx     Health Maintenance  Topic Date Due   MAMMOGRAM  12/18/2022   OPHTHALMOLOGY EXAM  02/06/2023   Hepatitis C Screening  09/05/2023 (Originally 09/01/1981)   HIV Screening  09/05/2023 (Originally 09/02/1978)    Diabetic kidney evaluation - eGFR measurement  09/05/2023   Diabetic kidney evaluation - Urine ACR  09/05/2023   FOOT EXAM  09/05/2023   HEMOGLOBIN A1C  09/05/2023   Colonoscopy  10/18/2026   DTaP/Tdap/Td (3 - Td or Tdap) 08/01/2030   HPV VACCINES  Aged Out   INFLUENZA VACCINE  Discontinued   PAP SMEAR-Modifier  Discontinued   COVID-19 Vaccine  Discontinued   Zoster Vaccines- Shingrix  Discontinued     ----------------------------------------------------------------------------------------------------------------------------------------------------------------------------------------------------------------- Physical Exam BP 138/80 (BP Location: Left Arm, Patient Position: Sitting, Cuff Size: Large)   Pulse 62   Ht 6' (1.829 m)   Wt 229 lb (103.9 kg)   SpO2 99%   BMI 31.06 kg/m   Physical Exam Constitutional:      Appearance: Normal appearance.  HENT:     Head: Normocephalic and atraumatic.  Eyes:     General: No scleral icterus. Cardiovascular:     Rate and Rhythm: Normal rate and regular rhythm.  Pulmonary:     Effort: Pulmonary effort is normal.     Breath sounds: Normal breath sounds.  Musculoskeletal:     Cervical back: Neck supple.  Neurological:     Mental Status: She is alert.  Psychiatric:        Mood and Affect: Mood normal.        Behavior: Behavior normal.     ------------------------------------------------------------------------------------------------------------------------------------------------------------------------------------------------------------------- Assessment and Plan  Type 2 diabetes mellitus with diabetic polyneuropathy (HCC) Diabetes remains fairly well controlled.  Continue Evaristo Bury and Jardiance at current strength.  HTN (hypertension) BP remains well controlled with losartan at current strength.    Hyperlipidemia associated with type 2 diabetes mellitus (HCC) Tolerating atoravastatin at current strength.   Lab Results   Component Value Date   LDLCALC 79 09/05/2022     Meds ordered this encounter  Medications   empagliflozin (JARDIANCE) 25 MG TABS tablet    Sig: Take 1 tablet (25 mg total) by mouth daily.    Dispense:  90 tablet    Refill:  3   insulin degludec (TRESIBA FLEXTOUCH) 100 UNIT/ML FlexTouch Pen    Sig: Inject 45 Units into the skin daily.    Dispense:  15 mL    Refill:  3   pantoprazole (PROTONIX) 40 MG tablet    Sig: Take 1 tablet (40 mg total) by mouth daily.    Dispense:  90 tablet    Refill:  4   losartan (COZAAR) 100 MG tablet    Sig: Take 1 tablet (100 mg total) by mouth daily.    Dispense:  90 tablet    Refill:  1    Return in about 6 months (around 09/05/2023) for Annual exam/Fasting labs.    This visit occurred during the SARS-CoV-2 public health emergency.  Safety protocols were in place, including screening questions  prior to the visit, additional usage of staff PPE, and extensive cleaning of exam room while observing appropriate contact time as indicated for disinfecting solutions.

## 2023-03-12 NOTE — Assessment & Plan Note (Addendum)
Tolerating atoravastatin at current strength.   Lab Results  Component Value Date   LDLCALC 79 09/05/2022

## 2023-03-12 NOTE — Assessment & Plan Note (Signed)
BP remains well controlled with losartan at current strength.

## 2023-03-12 NOTE — Assessment & Plan Note (Signed)
Diabetes remains fairly well controlled.  Continue Gwendolyn Fleming and Jardiance at current strength.

## 2023-03-14 ENCOUNTER — Encounter: Payer: Self-pay | Admitting: Family Medicine

## 2023-03-14 ENCOUNTER — Other Ambulatory Visit: Payer: Self-pay | Admitting: Family Medicine

## 2023-03-14 DIAGNOSIS — E1142 Type 2 diabetes mellitus with diabetic polyneuropathy: Secondary | ICD-10-CM

## 2023-03-14 MED ORDER — TRESIBA FLEXTOUCH 100 UNIT/ML ~~LOC~~ SOPN: PEN_INJECTOR | SUBCUTANEOUS | 3 refills | Status: DC

## 2023-04-26 ENCOUNTER — Ambulatory Visit: Payer: Managed Care, Other (non HMO)

## 2023-04-26 DIAGNOSIS — Z1231 Encounter for screening mammogram for malignant neoplasm of breast: Secondary | ICD-10-CM | POA: Diagnosis not present

## 2023-05-13 IMAGING — RF DG ESOPHAGUS
5 series · 11 of 11 positions shown · non-contrast
Comparison: None.

CLINICAL DATA: Dysphagia, gastroesophageal reflux, hiatal hernia,
prior dilatations

EXAM:
ESOPHOGRAM / BARIUM SWALLOW / BARIUM TABLET STUDY
TECHNIQUE: Combined double contrast and single contrast examination performed
using effervescent crystals, thick barium liquid, and thin barium
liquid. The patient was observed with fluoroscopy swallowing a 13 mm
barium sulphate tablet.
FLUOROSCOPY TIME:  Fluoroscopy Time:  6 seconds
Radiation Exposure Index (if provided by the fluoroscopic device):
10.4 mGy

[Series 1: cp_standard · 0.53mm/px · 4 of 125 frames shown (1 of 5)]
[frame 1/125]
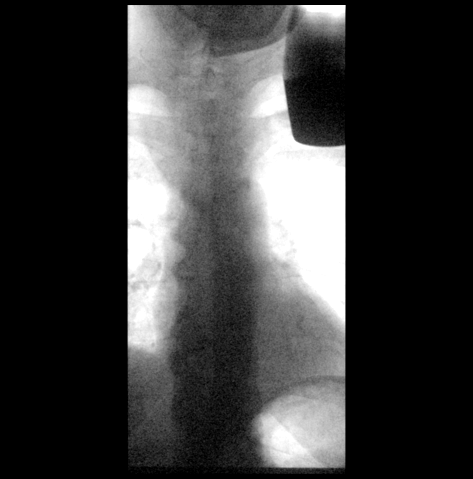
[frame 19/125]
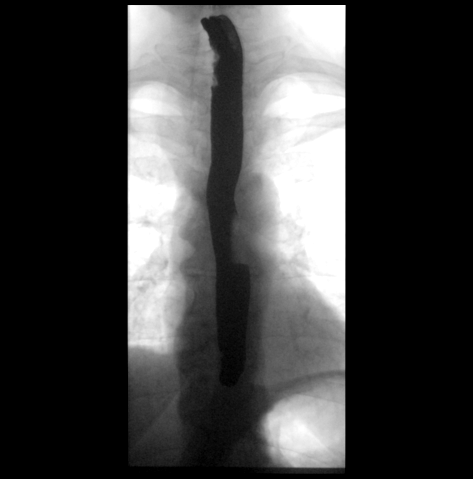
[frame 63/125]
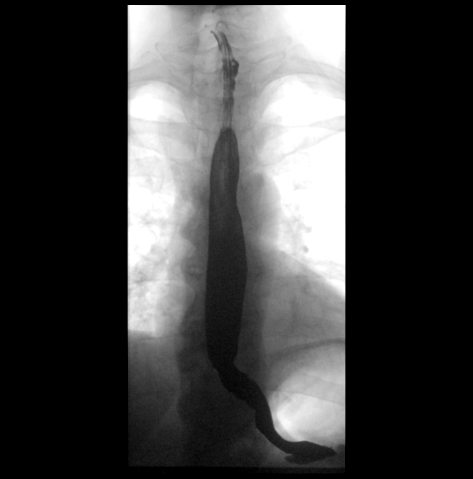
[frame 107/125]
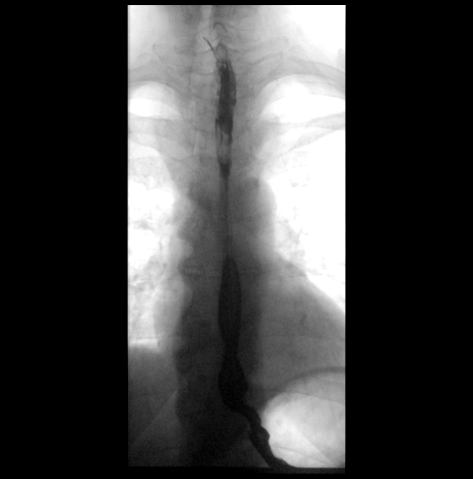

[Series 2: cp_standard · 0.53mm/px · 4 of 204 frames shown (2 of 5)]
[frame 31/204]
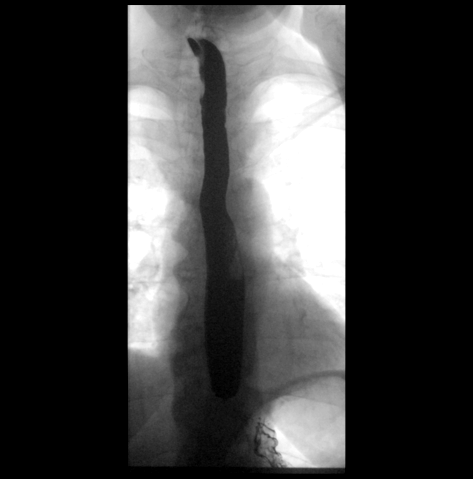
[frame 103/204]
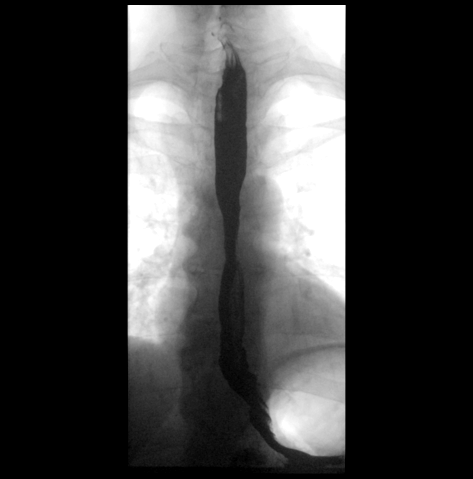
[frame 172/204]
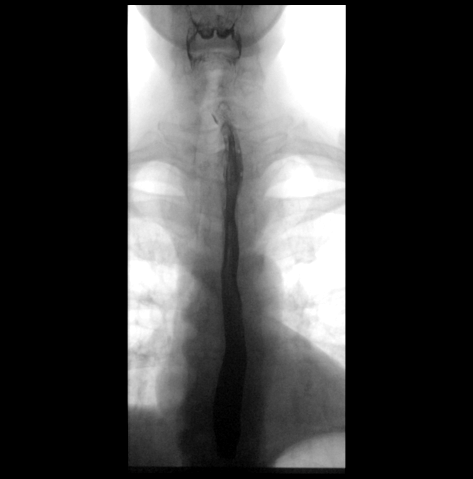
[frame 174/204]
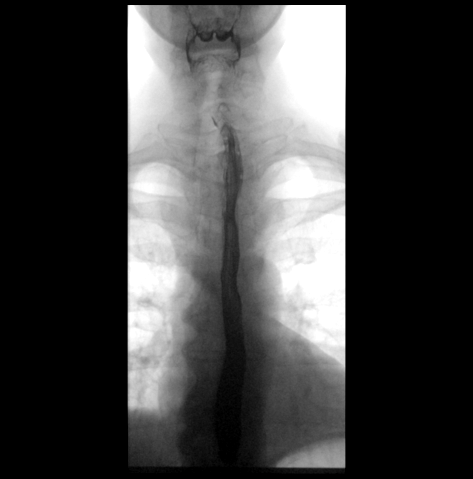

[Series 3: cp_standard · 0.26mm/px · 1 of 1 slices shown (3 of 5)]
[im 1/1]
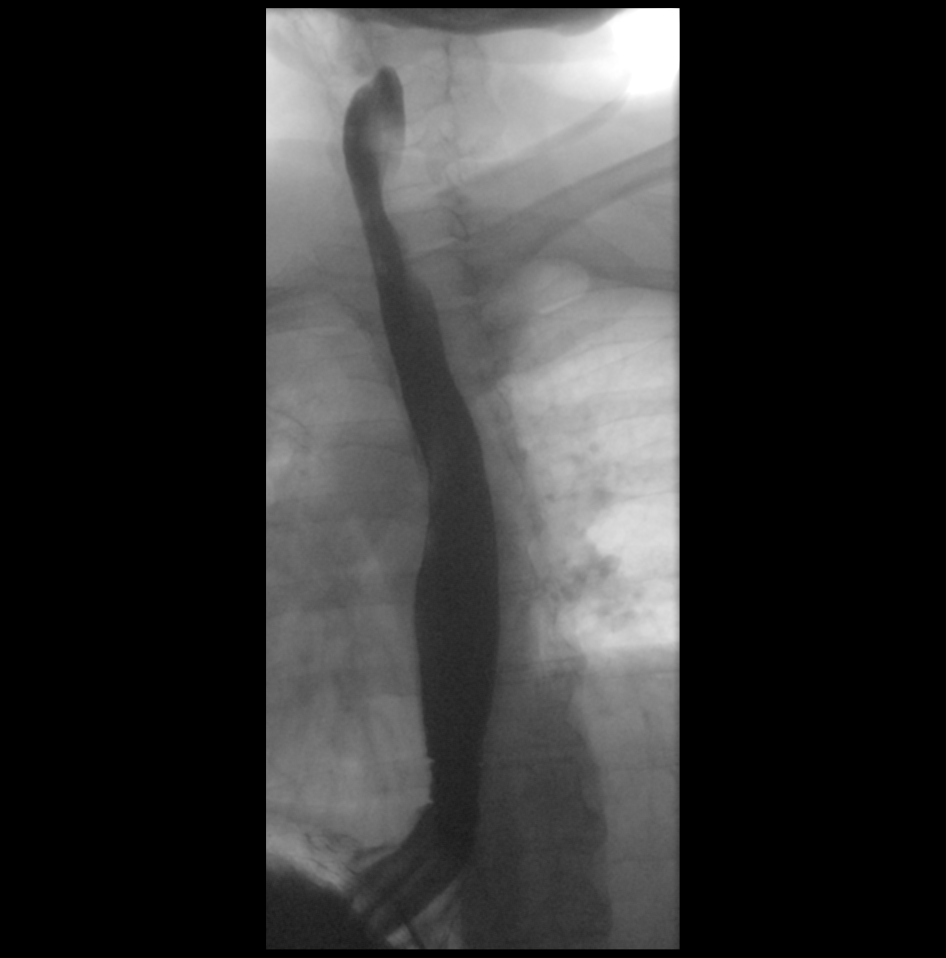

[Series 4: cp_standard · 0.26mm/px · 1 of 1 slices shown (4 of 5)]
[im 1/1]
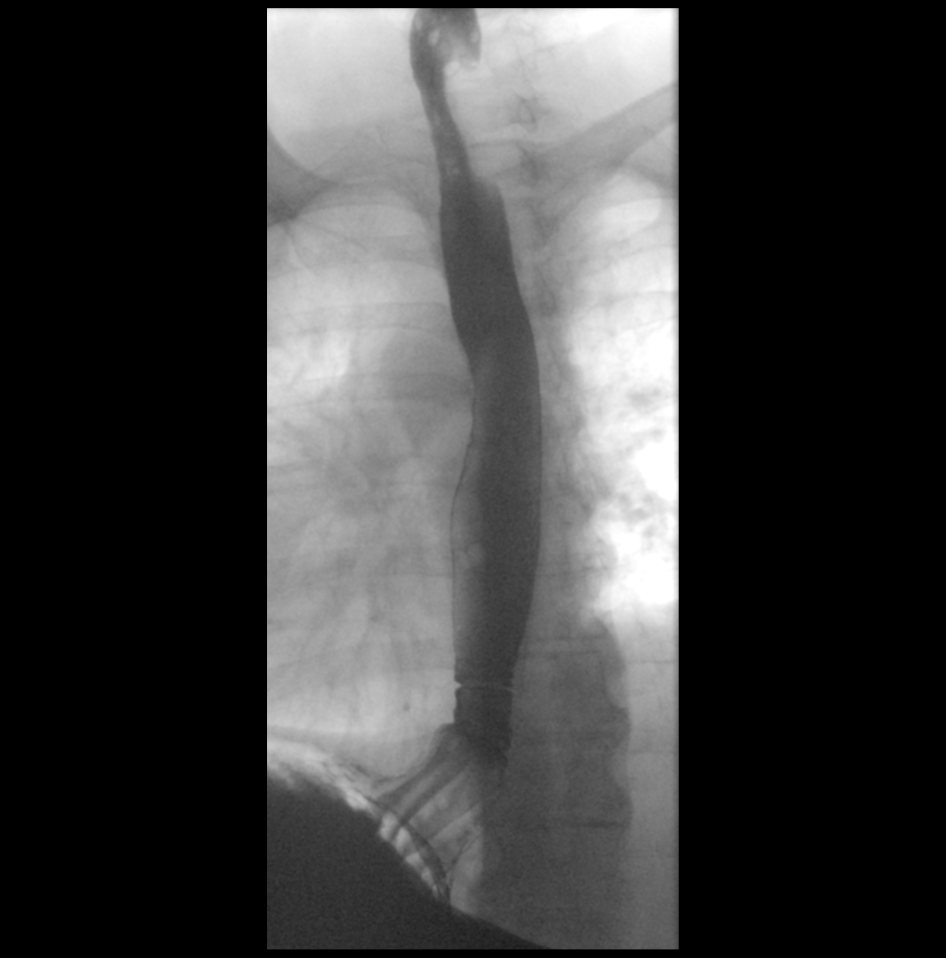

[Series 5: cp_standard · 0.27mm/px · 1 of 1 slices shown (5 of 5)]
[im 1/1]
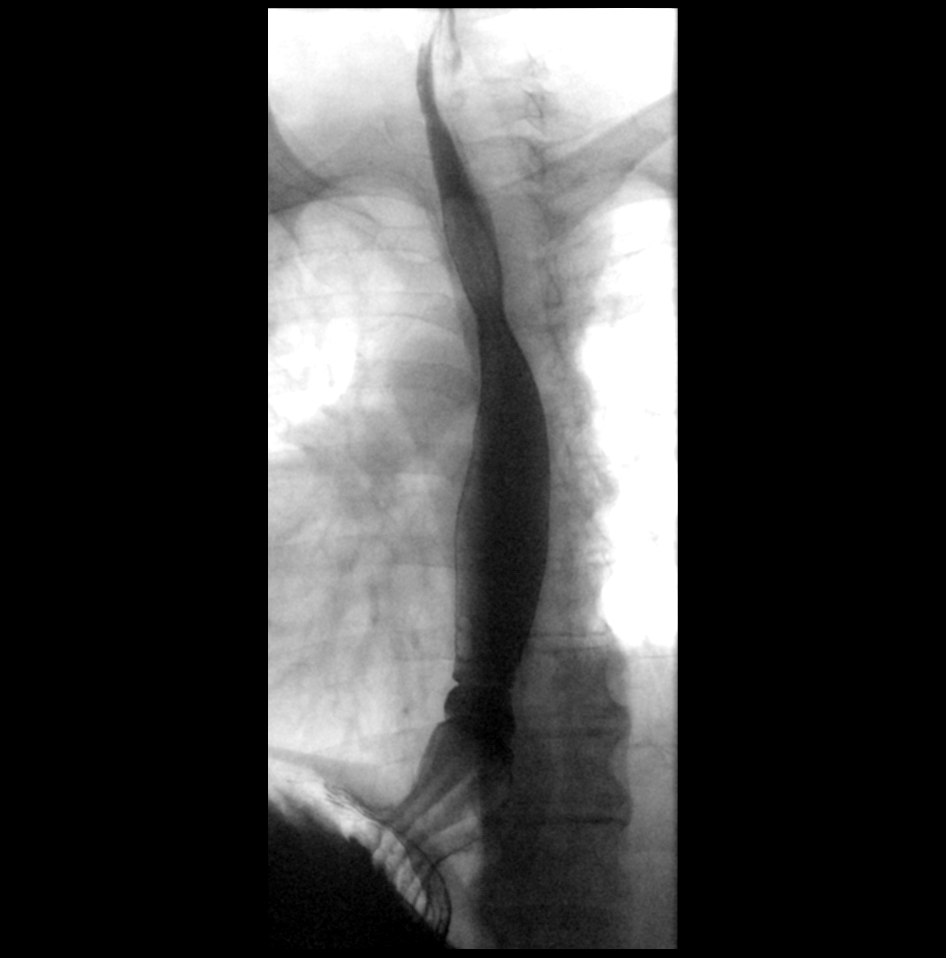

[11 of 11 positions shown; findings below may reference images not displayed]

FINDINGS: Patient swallowed barium without difficulty. There is no mass,
stricture, or other abnormality identified. Normal motility. Small
sliding hiatal hernia is present that reduces when standing. No
evidence of spontaneous gastroesophageal reflux. Symptoms were not
reproduced during study. Barium pill was swallowed and passed
without difficulty.
IMPRESSION: No mass or stricture.

Small sliding hiatal hernia.

## 2023-05-25 ENCOUNTER — Encounter: Payer: Self-pay | Admitting: Family Medicine

## 2023-05-31 ENCOUNTER — Encounter: Payer: Self-pay | Admitting: Family Medicine

## 2023-05-31 NOTE — Telephone Encounter (Signed)
Care team updated and letter sent for eye exam notes.

## 2023-06-09 ENCOUNTER — Other Ambulatory Visit: Payer: Self-pay | Admitting: Medical Genetics

## 2023-07-10 ENCOUNTER — Other Ambulatory Visit (HOSPITAL_COMMUNITY)
Admission: RE | Admit: 2023-07-10 | Discharge: 2023-07-10 | Disposition: A | Discharge status: Home / Self Care | Payer: Self-pay | Source: Ambulatory Visit | Attending: Medical Genetics | Admitting: Medical Genetics

## 2023-07-21 LAB — GENECONNECT MOLECULAR SCREEN: Genetic Analysis Overall Interpretation: NEGATIVE

## 2023-08-17 ENCOUNTER — Encounter: Payer: Self-pay | Admitting: Family Medicine

## 2023-08-18 MED ORDER — ATORVASTATIN CALCIUM 10 MG PO TABS: 10.0000 mg | ORAL_TABLET | Freq: Every day | ORAL | 0 refills | Status: DC

## 2023-08-23 ENCOUNTER — Other Ambulatory Visit: Payer: Self-pay | Admitting: Family Medicine

## 2023-09-11 ENCOUNTER — Encounter: Payer: Managed Care, Other (non HMO) | Admitting: Family Medicine

## 2023-09-19 ENCOUNTER — Encounter: Payer: Self-pay | Admitting: Family Medicine

## 2023-09-19 ENCOUNTER — Ambulatory Visit (INDEPENDENT_AMBULATORY_CARE_PROVIDER_SITE_OTHER): Payer: Managed Care, Other (non HMO) | Admitting: Family Medicine

## 2023-09-19 VITALS — BP 134/78 | HR 63 | Ht 72.0 in | Wt 232.0 lb

## 2023-09-19 DIAGNOSIS — Z794 Long term (current) use of insulin: Secondary | ICD-10-CM

## 2023-09-19 DIAGNOSIS — E1142 Type 2 diabetes mellitus with diabetic polyneuropathy: Secondary | ICD-10-CM

## 2023-09-19 DIAGNOSIS — I10 Essential (primary) hypertension: Secondary | ICD-10-CM

## 2023-09-19 DIAGNOSIS — Z0000 Encounter for general adult medical examination without abnormal findings: Secondary | ICD-10-CM | POA: Diagnosis not present

## 2023-09-19 DIAGNOSIS — E785 Hyperlipidemia, unspecified: Secondary | ICD-10-CM

## 2023-09-19 DIAGNOSIS — E1169 Type 2 diabetes mellitus with other specified complication: Secondary | ICD-10-CM

## 2023-09-19 LAB — POCT GLYCOSYLATED HEMOGLOBIN (HGB A1C): HbA1c, POC (controlled diabetic range): 7.3 % — AB (ref 0.0–7.0)

## 2023-09-19 NOTE — Assessment & Plan Note (Signed)
Diabetes remains fairly well controlled.  Continue Gwendolyn Fleming and Jardiance at current strength.

## 2023-09-19 NOTE — Patient Instructions (Signed)
 Preventive Care 16-60 Years Old, Female  Preventive care refers to lifestyle choices and visits with your health care provider that can promote health and wellness. Preventive care visits are also called wellness exams.  What can I expect for my preventive care visit?  Counseling  Your health care provider may ask you questions about your:  Medical history, including:  Past medical problems.  Family medical history.  Pregnancy history.  Current health, including:  Menstrual cycle.  Method of birth control.  Emotional well-being.  Home life and relationship well-being.  Sexual activity and sexual health.  Lifestyle, including:  Alcohol, nicotine or tobacco, and drug use.  Access to firearms.  Diet, exercise, and sleep habits.  Work and work Astronomer.  Sunscreen use.  Safety issues such as seatbelt and bike helmet use.  Physical exam  Your health care provider will check your:  Height and weight. These may be used to calculate your BMI (body mass index). BMI is a measurement that tells if you are at a healthy weight.  Waist circumference. This measures the distance around your waistline. This measurement also tells if you are at a healthy weight and may help predict your risk of certain diseases, such as type 2 diabetes and high blood pressure.  Heart rate and blood pressure.  Body temperature.  Skin for abnormal spots.  What immunizations do I need?    Vaccines are usually given at various ages, according to a schedule. Your health care provider will recommend vaccines for you based on your age, medical history, and lifestyle or other factors, such as travel or where you work.  What tests do I need?  Screening  Your health care provider may recommend screening tests for certain conditions. This may include:  Lipid and cholesterol levels.  Diabetes screening. This is done by checking your blood sugar (glucose) after you have not eaten for a while (fasting).  Pelvic exam and Pap test.  Hepatitis B test.  Hepatitis C  test.  HIV (human immunodeficiency virus) test.  STI (sexually transmitted infection) testing, if you are at risk.  Lung cancer screening.  Colorectal cancer screening.  Mammogram. Talk with your health care provider about when you should start having regular mammograms. This may depend on whether you have a family history of breast cancer.  BRCA-related cancer screening. This may be done if you have a family history of breast, ovarian, tubal, or peritoneal cancers.  Bone density scan. This is done to screen for osteoporosis.  Talk with your health care provider about your test results, treatment options, and if necessary, the need for more tests.  Follow these instructions at home:  Eating and drinking    Eat a diet that includes fresh fruits and vegetables, whole grains, lean protein, and low-fat dairy products.  Take vitamin and mineral supplements as recommended by your health care provider.  Do not drink alcohol if:  Your health care provider tells you not to drink.  You are pregnant, may be pregnant, or are planning to become pregnant.  If you drink alcohol:  Limit how much you have to 0-1 drink a day.  Know how much alcohol is in your drink. In the [place] one drink equals one 12 oz bottle of beer (355 mL), one 5 oz glass of wine (148 mL), or one 1 oz glass of hard liquor (44 mL).  Lifestyle  Brush your teeth every morning and night with fluoride toothpaste. Floss one time each day.  Exercise for at least  30 minutes 5 or more days each week.  Do not use any products that contain nicotine or tobacco. These products include cigarettes, chewing tobacco, and vaping devices, such as e-cigarettes. If you need help quitting, ask your health care provider.  Do not use drugs.  If you are sexually active, practice safe sex. Use a condom or other form of protection to prevent STIs.  If you do not wish to become pregnant, use a form of birth control. If you plan to become pregnant, see your health care provider for a  prepregnancy visit.  Take aspirin only as told by your health care provider. Make sure that you understand how much to take and what form to take. Work with your health care provider to find out whether it is safe and beneficial for you to take aspirin daily.  Find healthy ways to manage stress, such as:  Meditation, yoga, or listening to music.  Journaling.  Talking to a trusted person.  Spending time with friends and family.  Minimize exposure to UV radiation to reduce your risk of skin cancer.  Safety  Always wear your seat belt while driving or riding in a vehicle.  Do not drive:  If you have been drinking alcohol. Do not ride with someone who has been drinking.  When you are tired or distracted.  While texting.  If you have been using any mind-altering substances or drugs.  Wear a helmet and other protective equipment during sports activities.  If you have firearms in your house, make sure you follow all gun safety procedures.  Seek help if you have been physically or sexually abused.  What's next?  Visit your health care provider once a year for an annual wellness visit.  Ask your health care provider how often you should have your eyes and teeth checked.  Stay up to date on all vaccines.  This information is not intended to replace advice given to you by your health care provider. Make sure you discuss any questions you have with your health care provider.  Document Revised: 12/16/2020 Document Reviewed: 12/16/2020  Elsevier Patient Education  2024 [employer].

## 2023-09-19 NOTE — Progress Notes (Signed)
 Gwendolyn Fleming - 58 y.o. female MRN [id number]  Date of birth: [birth year]  Subjective Chief Complaint  Patient presents with   Annual Exam    HPI Gwendolyn Fleming is a 58 y.o. female here today for annual exam.   She reports that she is doing well.  Continues on [place].  A1c today is 7.3%.  She has diminished sensation in her feet b/l  She remains moderately active.   She admits she could do better with diet.    She is a non-smoker.  No EtOH at this time.   Review of Systems  Constitutional:  Negative for chills, fever, malaise/fatigue and weight loss.  HENT:  Negative for congestion, ear pain and sore throat.   Eyes:  Negative for blurred vision, double vision and pain.  Respiratory:  Negative for cough and shortness of breath.   Cardiovascular:  Negative for chest pain and palpitations.  Gastrointestinal:  Negative for abdominal pain, blood in stool, constipation, heartburn and nausea.  Genitourinary:  Negative for dysuria and urgency.  Musculoskeletal:  Negative for joint pain and myalgias.  Neurological:  Negative for dizziness and headaches.  Endo/Heme/Allergies:  Does not bruise/bleed easily.  Psychiatric/Behavioral:  Negative for depression. The patient is not nervous/anxious and does not have insomnia.     Allergies  Allergen Reactions   Penicillins    Cephalosporins    Omeprazole Diarrhea   Metformin Nausea And Vomiting    Upset stomach   Other Rash    General Anesthesia   Tape Rash    Past Medical History:  Diagnosis Date   GERD (gastroesophageal reflux disease)    Hiatal hernia    Hyperlipemia    Hypertension    Peripheral neuropathy    Skin carcinoma    Type II diabetes mellitus (HCC)    Ulnar nerve damage    bilateral    Past Surgical History:  Procedure Laterality Date   ABDOMINAL HYSTERECTOMY     AUGMENTATION MAMMAPLASTY     BREAST REDUCTION SURGERY Bilateral 2001   CESAREAN SECTION     CHOLECYSTECTOMY     COLONOSCOPY   10/17/2016   [facility]. Normal colonoscopy. In Epic   ESOPHAGOGASTRODUODENOSCOPY  10/17/2016   [facility] [place]. Normal upper endoscopy except a small hiatal hernia was observed. 17mm stricture was noted at esophago-gastric junction. Mucosa inside the stricture appear normal. Endoscope passed through. Multiple biopsies were taken. Dilitation: esophagus 37cm from the incisors up to 20mm   ESOPHAGOGASTRODUODENOSCOPY  01/28/2020   Small hiatal hernia.GE junction widely patent without stricture noted. s/p forcep biopsy taken from mid to distal esophagus. Esophagus appeared normal. Stomach body and fundus areas several small 2 to 4 mm polyps scattered in stomach body/fundus areas, probably benign fundic gland polyps; s/p sample forceps biopsy taken from stomach body area.Stomach appeared normal. Duodenum apperared normal.    Social History   Socioeconomic History   Marital status: Married    Spouse name: Natayah Warmack   Number of children: 2   Years of education: Not on file   Highest education level: Associate degree: occupational, Scientist, product/process development, or vocational program  Occupational History   Occupation: Scientist, clinical (histocompatibility and immunogenetics)  Tobacco Use   Smoking status: Never   Smokeless tobacco: Never  Vaping Use   Vaping status: Never Used  Substance and Sexual Activity   Alcohol use: Never   Drug use: Never   Sexual activity: Not Currently    Partners: Male  Other Topics Concern   Not on  file  Social History Narrative   Not on file   Social Drivers of Health   Financial Resource Strain: Low Risk  (09/15/2023)   Overall Financial Resource Strain (CARDIA)    Difficulty of Paying Living Expenses: Not hard at all  Food Insecurity: No Food Insecurity (09/15/2023)   Hunger Vital Sign    Worried About Running Out of Food in the Last Year: Never true    Ran Out of Food in the Last Year: Never true  Transportation Needs: No Transportation Needs (09/15/2023)    PRAPARE - Administrator, Civil Service (Medical): No    Lack of Transportation (Non-Medical): No  Physical Activity: Insufficiently Active (09/15/2023)   Exercise Vital Sign    Days of Exercise per Week: 2 days    Minutes of Exercise per Session: 20 min  Stress: No Stress Concern Present (09/15/2023)   [employer] of Occupational Health - Occupational Stress Questionnaire    Feeling of Stress : Only a little  Social Connections: Socially Integrated (09/15/2023)   Social Connection and Isolation Panel [NHANES]    Frequency of Communication with Friends and Family: More than three times a week    Frequency of Social Gatherings with Friends and Family: Three times a week    Attends Religious Services: More than 4 times per year    Active Member of Clubs or Organizations: Yes    Attends Engineer, structural: More than 4 times per year    Marital Status: Married    Family History  Problem Relation Age of Onset   Skin cancer Mother    Irritable bowel syndrome Mother    Diabetes Father    Hypertension Father    Diabetes Brother    Diabetes Maternal Grandmother    Diverticulitis Maternal Grandmother    Breast cancer Cousin    Colon cancer Neg Hx    Rectal cancer Neg Hx    Pancreatic cancer Neg Hx    Esophageal cancer Neg Hx    Liver cancer Neg Hx    Stomach cancer Neg Hx     Health Maintenance  Topic Date Due   HIV Screening  Never done   Hepatitis C Screening  Never done   Pneumococcal Vaccine 35-23 Years old (2 of 2 - PCV) 09/06/2006   OPHTHALMOLOGY EXAM  02/06/2023   Diabetic kidney evaluation - eGFR measurement  09/05/2023   Diabetic kidney evaluation - Urine ACR  09/05/2023   HEMOGLOBIN A1C  03/21/2024   FOOT EXAM  09/18/2024   MAMMOGRAM  04/25/2025   Colonoscopy  10/18/2026   DTaP/Tdap/Td (3 - Td or Tdap) 08/01/2030   HPV VACCINES  Aged Out   INFLUENZA VACCINE  Discontinued   COVID-19 Vaccine  Discontinued   Zoster Vaccines- Shingrix   Discontinued     ----------------------------------------------------------------------------------------------------------------------------------------------------------------------------------------------------------------- Physical Exam BP 134/78 (BP Location: Left Arm, Patient Position: Sitting, Cuff Size: Large)   Pulse 63   Ht 6' (1.829 m)   Wt 232 lb (105.2 kg)   SpO2 100%   BMI 31.46 kg/m   Physical Exam Constitutional:      General: She is not in acute distress. HENT:     Head: Normocephalic and atraumatic.     Right Ear: Tympanic membrane and ear canal normal.     Left Ear: Tympanic membrane and ear canal normal.     Nose: Nose normal.  Eyes:     General: No scleral icterus.    Conjunctiva/sclera: Conjunctivae normal.  Neck:  Thyroid: No thyromegaly.  Cardiovascular:     Rate and Rhythm: Normal rate and regular rhythm.     Heart sounds: Normal heart sounds.  Pulmonary:     Effort: Pulmonary effort is normal.     Breath sounds: Normal breath sounds.  Abdominal:     General: Bowel sounds are normal. There is no distension.     Palpations: Abdomen is soft.     Tenderness: There is no abdominal tenderness. There is no guarding.  Musculoskeletal:        General: Normal range of motion.     Cervical back: Normal range of motion and neck supple.  Lymphadenopathy:     Cervical: No cervical adenopathy.  Skin:    General: Skin is warm and dry.     Findings: No rash.  Neurological:     General: No focal deficit present.     Mental Status: She is alert and oriented to person, place, and time.     Cranial Nerves: No cranial nerve deficit.     Coordination: Coordination normal.  Psychiatric:        Mood and Affect: Mood normal.        Behavior: Behavior normal.      ------------------------------------------------------------------------------------------------------------------------------------------------------------------------------------------------------------------- Assessment and Plan  HTN (hypertension) BP remains well controlled with losartan at current strength.    Hyperlipidemia associated with type 2 diabetes mellitus (HCC) Tolerating atoravastatin at current strength.   Lab Results  Component Value Date   LDLCALC 79 09/05/2022     Well adult exam Well adult Orders Placed This Encounter  Procedures   CMP14+EGFR   CBC with Differential/Platelet   Lipid Panel With LDL/HDL Ratio   Urine Microalbumin w/creat. ratio  Screenings: per lab orders Immunizations:  Declines pneumonia vaccine Anticipatory guidance/Risk factor reduction:  Recommendations per AVS.   Type 2 diabetes mellitus with diabetic polyneuropathy (HCC) Diabetes remains fairly well controlled.  Continue Evaristo Bury and Jardiance at current strength.   No orders of the defined types were placed in this encounter.   No follow-ups on file.    This visit occurred during the SARS-CoV-2 public health emergency.  Safety protocols were in place, including screening questions prior to the visit, additional usage of staff PPE, and extensive cleaning of exam room while observing appropriate contact time as indicated for disinfecting solutions.

## 2023-09-19 NOTE — Assessment & Plan Note (Signed)
Tolerating atoravastatin at current strength.   Lab Results  Component Value Date   LDLCALC 79 09/05/2022

## 2023-09-19 NOTE — Assessment & Plan Note (Addendum)
 Well adult Orders Placed This Encounter  Procedures   CMP14+EGFR   CBC with Differential/Platelet   Lipid Panel With LDL/HDL Ratio   Urine Microalbumin w/creat. ratio  Screenings: per lab orders Immunizations:  Declines pneumonia vaccine Anticipatory guidance/Risk factor reduction:  Recommendations per AVS.

## 2023-09-19 NOTE — Assessment & Plan Note (Signed)
BP remains well controlled with losartan at current strength.

## 2023-09-21 LAB — CBC WITH DIFFERENTIAL/PLATELET
Basophils Absolute: 0 10*3/uL (ref 0.0–0.2)
Basos: 1 %
EOS (ABSOLUTE): 0.2 10*3/uL (ref 0.0–0.4)
Eos: 2 %
Hematocrit: 42.9 % (ref 34.0–46.6)
Hemoglobin: 14.2 g/dL (ref 11.1–15.9)
Immature Grans (Abs): 0 10*3/uL (ref 0.0–0.1)
Immature Granulocytes: 0 %
Lymphocytes Absolute: 1.6 10*3/uL (ref 0.7–3.1)
Lymphs: 27 %
MCH: 30.3 pg (ref 26.6–33.0)
MCHC: 33.1 g/dL (ref 31.5–35.7)
MCV: 92 fL (ref 79–97)
Monocytes Absolute: 0.4 10*3/uL (ref 0.1–0.9)
Monocytes: 6 %
Neutrophils Absolute: 4 10*3/uL (ref 1.4–7.0)
Neutrophils: 64 %
Platelets: 236 10*3/uL (ref 150–450)
RBC: 4.69 x10E6/uL (ref 3.77–5.28)
RDW: 12.3 % (ref 11.7–15.4)
WBC: 6.2 10*3/uL (ref 3.4–10.8)

## 2023-09-21 LAB — CMP14+EGFR
ALT: 17 IU/L (ref 0–32)
AST: 15 IU/L (ref 0–40)
Albumin: 4.3 g/dL (ref 3.8–4.9)
Alkaline Phosphatase: 112 IU/L (ref 44–121)
BUN/Creatinine Ratio: 20 (ref 12–28)
BUN: 17 mg/dL (ref 8–27)
Bilirubin Total: 0.3 mg/dL (ref 0.0–1.2)
CO2: 25 mmol/L (ref 20–29)
Calcium: 9 mg/dL (ref 8.7–10.3)
Chloride: 102 mmol/L (ref 96–106)
Creatinine, Ser: 0.83 mg/dL (ref 0.57–1.00)
Globulin, Total: 2.4 g/dL (ref 1.5–4.5)
Glucose: 98 mg/dL (ref 70–99)
Potassium: 4.3 mmol/L (ref 3.5–5.2)
Sodium: 139 mmol/L (ref 134–144)
Total Protein: 6.7 g/dL (ref 6.0–8.5)
eGFR: 81 mL/min/{1.73_m2} (ref >59)

## 2023-09-21 LAB — MICROALBUMIN / CREATININE URINE RATIO
Creatinine, Urine: 136.3 mg/dL
Microalb/Creat Ratio: 7 mg/g{creat} (ref 0–29)
Microalbumin, Urine: 9 ug/mL

## 2023-09-21 LAB — LIPID PANEL WITH LDL/HDL RATIO
Cholesterol, Total: 143 mg/dL (ref 100–199)
HDL: 57 mg/dL (ref >39)
LDL Chol Calc (NIH): 63 mg/dL (ref 0–99)
LDL/HDL Ratio: 1.1 ratio (ref 0.0–3.2)
Triglycerides: 133 mg/dL (ref 0–149)
VLDL Cholesterol Cal: 23 mg/dL (ref 5–40)

## 2023-09-22 ENCOUNTER — Encounter: Payer: Self-pay | Admitting: Family Medicine

## 2023-10-19 LAB — HM DIABETES EYE EXAM

## 2023-10-27 ENCOUNTER — Other Ambulatory Visit: Payer: Self-pay | Admitting: Family Medicine

## 2023-10-27 DIAGNOSIS — I10 Essential (primary) hypertension: Secondary | ICD-10-CM

## 2023-11-10 ENCOUNTER — Other Ambulatory Visit: Payer: Self-pay | Admitting: Family Medicine

## 2023-11-17 ENCOUNTER — Ambulatory Visit
Admission: EM | Admit: 2023-11-17 | Discharge: 2023-11-17 | Disposition: A | Discharge status: Home / Self Care | Attending: Emergency Medicine | Admitting: Emergency Medicine

## 2023-11-17 ENCOUNTER — Other Ambulatory Visit: Payer: Self-pay

## 2023-11-17 ENCOUNTER — Ambulatory Visit (INDEPENDENT_AMBULATORY_CARE_PROVIDER_SITE_OTHER)

## 2023-11-17 DIAGNOSIS — R0781 Pleurodynia: Secondary | ICD-10-CM

## 2023-11-17 DIAGNOSIS — W19XXXA Unspecified fall, initial encounter: Secondary | ICD-10-CM

## 2023-11-17 DIAGNOSIS — Y92009 Unspecified place in unspecified non-institutional (private) residence as the place of occurrence of the external cause: Secondary | ICD-10-CM

## 2023-11-17 DIAGNOSIS — S2231XA Fracture of one rib, right side, initial encounter for closed fracture: Secondary | ICD-10-CM

## 2023-11-17 NOTE — ED Provider Notes (Signed)
 [facility]    CSN: [id number] Arrival date & time: 11/17/23  1019    HISTORY   Chief Complaint  Patient presents with   Rib Injury   HPI Gwendolyn Fleming is a pleasant, 58 y.o. female who presents to urgent care today. Patient states that while attempting to cut fabric on a quilting table, she was stretching to reach the very end of the cut, was on her tiptoes, lost her balance and fell hard on her right upper ribs against the table.  Patient endorses pain with deep inspiration, lifting right arm.  Denies shortness of breath.  Patient has a decreased O2 saturation on arrival today with an elevated blood pressure, states she did take her blood pressure medication this morning.  Patient states she is a never smoker, denies hx COPD.  The history is provided by the patient.   Past Medical History:  Diagnosis Date   GERD (gastroesophageal reflux disease)    Hiatal hernia    Hyperlipemia    Hypertension    Peripheral neuropathy    Skin carcinoma    Type II diabetes mellitus (HCC)    Ulnar nerve damage    bilateral   Patient Active Problem List   Diagnosis Date Noted   Well adult exam 09/19/2023   Neoplasm of uncertain behavior of skin 08/30/2021   Onychomycosis 08/30/2021   Esophageal dysphagia 02/28/2021   Encounter for screening mammogram for malignant neoplasm of breast 11/26/2020   Hyperlipidemia associated with type 2 diabetes mellitus (HCC) 11/26/2020   Hiatal hernia with GERD 01/28/2020   HTN (hypertension) 05/10/2017   Personal history of other malignant neoplasm of skin 06/20/2016   Other abnormal and inconclusive findings on diagnostic imaging of breast 04/07/2010   Acquired absence of both cervix and uterus 11/14/2008   Cholelithiasis 10/09/2008   Type 2 diabetes mellitus with diabetic polyneuropathy (HCC) 08/12/2005   Past Surgical History:  Procedure Laterality Date   ABDOMINAL HYSTERECTOMY     AUGMENTATION MAMMAPLASTY     BREAST REDUCTION SURGERY  Bilateral 2001   CESAREAN SECTION     CHOLECYSTECTOMY     COLONOSCOPY  10/17/2016   [facility] [place] . Normal colonoscopy. In Epic   ESOPHAGOGASTRODUODENOSCOPY  10/17/2016   [facility] [place] . Normal upper endoscopy except a small hiatal hernia was observed. 17mm stricture was noted at esophago-gastric junction. Mucosa inside the stricture appear normal. Endoscope passed through. Multiple biopsies were taken. Dilitation: esophagus 37cm from the incisors up to 20mm   ESOPHAGOGASTRODUODENOSCOPY  01/28/2020   Small hiatal hernia.GE junction widely patent without stricture noted. s/p forcep biopsy taken from mid to distal esophagus. Esophagus appeared normal. Stomach body and fundus areas several small 2 to 4 mm polyps scattered in stomach body/fundus areas, probably benign fundic gland polyps; s/p sample forceps biopsy taken from stomach body area.Stomach appeared normal. Duodenum apperared normal.   OB History   No obstetric history on file.    Home Medications    Prior to Admission medications   Medication Sig Start Date End Date Taking? Authorizing Provider  atorvastatin  (LIPITOR) 10 MG tablet Take 1 tablet by mouth once daily 11/10/23   Adela Holter, DO  Blood Glucose Monitoring Suppl (ACCU-CHEK GUIDE) w/Device KIT Check glucose 3 times daily. 09/06/22   Adela Holter, DO  empagliflozin  (JARDIANCE ) 25 MG TABS tablet Take 1 tablet (25 mg total) by mouth daily. 03/08/23 03/08/25  Adela Holter, DO  glucose blood (ACCU-CHEK GUIDE) test strip Check glucose 3 times daily. 09/06/22  Adela Holter, DO  insulin  degludec (TRESIBA  FLEXTOUCH) 100 UNIT/ML FlexTouch Pen Inject 45 Units into the skin daily. 03/14/23   Adela Holter, DO  Insulin  Pen Needle (NOVOFINE PEN NEEDLE) 32G X 6 MM MISC Use to inject insulin  up to 2 times daily.  E11.42 10/07/22   Adela Holter, DO  Lancets (ACCU-CHEK MULTICLIX) lancets Check glucose 3 times daily. 09/06/22   Adela Holter, DO   losartan  (COZAAR ) 100 MG tablet Take 1 tablet by mouth once daily 10/27/23   Adela Holter, DO  pantoprazole  (PROTONIX ) 40 MG tablet Take 1 tablet (40 mg total) by mouth daily. 03/08/23   Adela Holter, DO    Family History Family History  Problem Relation Age of Onset   Skin cancer Mother    Irritable bowel syndrome Mother    Diabetes Father    Hypertension Father    Diabetes Brother    Diabetes Maternal Grandmother    Diverticulitis Maternal Grandmother    Breast cancer Cousin    Colon cancer Neg Hx    Rectal cancer Neg Hx    Pancreatic cancer Neg Hx    Esophageal cancer Neg Hx    Liver cancer Neg Hx    Stomach cancer Neg Hx    Social History Social History   Tobacco Use   Smoking status: Never   Smokeless tobacco: Never  Vaping Use   Vaping status: Never Used  Substance Use Topics   Alcohol use: Never   Drug use: Never   Allergies   Penicillins, Cephalosporins, Omeprazole, Metformin, Other, and Tape  Review of Systems Review of Systems Pertinent findings revealed after performing a 14 point review of systems has been noted in the history of present illness.  Physical Exam Vital Signs BP (!) 165/88 (BP Location: Right Arm)   Pulse 72   Temp 98.3 F (36.8 C) (Oral)   Resp 16   SpO2 94%   No data found.  Physical Exam Vitals and nursing note reviewed.  Constitutional:      General: She is awake. She is in acute distress (Mild).     Appearance: Normal appearance. She is well-developed and well-groomed.  Pulmonary:     Breath sounds: No wheezing, rhonchi or rales.       Comments: Patient is splinting with deep inhalation Chest:    Musculoskeletal:     Right shoulder: Decreased range of motion (Decreased lateral extension secondary to pain).     Thoracic back: Tenderness present.       Back:  Neurological:     Mental Status: She is alert.  Psychiatric:        Behavior: Behavior is cooperative.     Visual Acuity Right Eye Distance:   Left  Eye Distance:   Bilateral Distance:    Right Eye Near:   Left Eye Near:    Bilateral Near:     UC Couse / Diagnostics / Procedures:     Radiology DG Chest 2 View Result Date: 11/17/2023 CLINICAL DATA:  Fall against edge of table, now with Rt lat rib pain, decreased O2 sat. EXAM: CHEST - 2 VIEW COMPARISON:  None Available. FINDINGS: Bilateral lung fields are clear. Bilateral costophrenic angles are clear. Normal cardio-mediastinal silhouette. There is incomplete lucency seen overlying the posterior aspect of right ninth rib, marked with electronic arrow. This may represent undisplaced rib fracture versus artifact. Correlate clinically. The soft tissues are within normal limits. There are surgical clips in the right upper quadrant, typical of a previous cholecystectomy. IMPRESSION:  1. No active cardiopulmonary disease. 2. There is incomplete lucency seen overlying the posterior aspect of right ninth rib, marked with electronic arrow. This may represent undisplaced rib fracture versus artifact. Correlate for point tenderness. Electronically Signed   By: Beula Brunswick M.D.   On: 11/17/2023 10:54    Procedures Procedures (including critical care time) EKG  Pending results:  Labs Reviewed - No data to display  Medications Ordered in UC: Medications - No data to display  UC Diagnoses / Final Clinical Impressions(s)   I have reviewed the triage vital signs and the nursing notes.  Pertinent labs & imaging results that were available during my care of the patient were reviewed by me and considered in my medical decision making (see chart for details).    Final diagnoses:  Rib pain on right side  Closed traumatic nondisplaced fracture of one rib of right side, initial encounter  Fall at home, initial encounter   Patient advised of radiologist findings not concerning for active cardiopulmonary disease and possible undisplaced fracture of ninth rib.  Based on my personal read of her x-ray  along with my physical exam findings and the fact that she plans to fly on an airplane tomorrow, I am concerned that her right middle lobe is not fully expanded and that CT scan is warranted.    Patient was advised of all the above and advised that CT scan before she leaves to go on her trip is recommended to ensure that her right middle lobe is fully expanded and that she can safely fly.  Patient agreed to go to the emergency room now for further evaluation.  Please see discharge instructions below for details of plan of care as provided to patient. ED Prescriptions   None    PDMP not reviewed this encounter.  Pending results:  Labs Reviewed - No data to display    Discharge Instructions      I very much appreciate your willingness to have a CT scan of your chest to be completely sure that all of the lobes of your right lung are completely expanded.  I think you will have a safe your trip knowing you are not at risk of complications from this fall.    I do believe that you should carry a small pillow with you to splint your ninth rib on the right whenever you are coughing or trying to take a deep breath.    You are welcome to take ibuprofen 400 to 600 mg every 6-8 hours as needed along with Tylenol 1000 mg every 8 hours on a scheduled basis for the next several days to help keep your pain under control.  Thank you for visiting [facility] today.  We appreciate the opportunity to participate in your care.    Disposition Upon Discharge:  Condition: stable for discharge home  Patient presented with an acute illness with associated systemic symptoms and significant discomfort requiring urgent management. In my opinion, this is a condition that a prudent lay person (someone who possesses an average knowledge of health and medicine) may potentially expect to result in complications if not addressed urgently such as respiratory distress, impairment of bodily function or  dysfunction of bodily organs.   Routine symptom specific, illness specific and/or disease specific instructions were discussed with the patient and/or caregiver at length.   As such, the patient has been evaluated and assessed, work-up was performed and treatment was provided in alignment with urgent care protocols and evidence  based medicine.  Patient/parent/caregiver has been advised that the patient may require follow up for further testing and treatment if the symptoms continue in spite of treatment, as clinically indicated and appropriate.  Patient/parent/caregiver has been advised to return to the [facility] or PCP if no better; to PCP or the Emergency Department if new signs and symptoms develop, or if the current signs or symptoms continue to change or worsen for further workup, evaluation and treatment as clinically indicated and appropriate  The patient will follow up with their current PCP if and as advised. If the patient does not currently have a PCP we will assist them in obtaining one.   The patient may need specialty follow up if the symptoms continue, in spite of conservative treatment and management, for further workup, evaluation, consultation and treatment as clinically indicated and appropriate.  Patient/parent/caregiver verbalized understanding and agreement of plan as discussed.  All questions were addressed during visit.  Please see discharge instructions below for further details of plan.  This office note has been dictated using Teaching laboratory technician.  Unfortunately, this method of dictation can sometimes lead to typographical or grammatical errors.  I apologize for your inconvenience in advance if this occurs.  Please do not hesitate to reach out to me if clarification is needed.      Eloise Hake Scales, PA-C 11/17/23 1112

## 2023-11-17 NOTE — Discharge Instructions (Signed)
 I very much appreciate your willingness to have a CT scan of your chest to be completely sure that all of the lobes of your right lung are completely expanded.  I think you will have a safe your trip knowing you are not at risk of complications from this fall.    I do believe that you should carry a small pillow with you to splint your ninth rib on the right whenever you are coughing or trying to take a deep breath.    You are welcome to take ibuprofen 400 to 600 mg every 6-8 hours as needed along with Tylenol 1000 mg every 8 hours on a scheduled basis for the next several days to help keep your pain under control.  Thank you for visiting [facility] today.  We appreciate the opportunity to participate in your care.

## 2023-11-17 NOTE — ED Triage Notes (Signed)
 Patient C/O pain right lateral ribs after falling 4 days ago. Patient states she struck that area on a table. C/O increased pain with movement and deep inspiration.

## 2024-01-11 ENCOUNTER — Encounter: Payer: Self-pay | Admitting: Family Medicine

## 2024-02-05 ENCOUNTER — Other Ambulatory Visit: Payer: Self-pay | Admitting: Family Medicine

## 2024-02-14 ENCOUNTER — Encounter: Payer: Self-pay | Admitting: Family Medicine

## 2024-02-26 ENCOUNTER — Other Ambulatory Visit: Payer: Self-pay | Admitting: Family Medicine

## 2024-02-26 DIAGNOSIS — E1142 Type 2 diabetes mellitus with diabetic polyneuropathy: Secondary | ICD-10-CM

## 2024-03-11 ENCOUNTER — Ambulatory Visit: Admitting: Family Medicine

## 2024-03-11 ENCOUNTER — Encounter: Payer: Self-pay | Admitting: Family Medicine

## 2024-03-11 VITALS — BP 127/76 | HR 60 | Ht 72.0 in | Wt 226.0 lb

## 2024-03-11 DIAGNOSIS — I10 Essential (primary) hypertension: Secondary | ICD-10-CM

## 2024-03-11 DIAGNOSIS — Z794 Long term (current) use of insulin: Secondary | ICD-10-CM

## 2024-03-11 DIAGNOSIS — E1142 Type 2 diabetes mellitus with diabetic polyneuropathy: Secondary | ICD-10-CM

## 2024-03-11 DIAGNOSIS — E1169 Type 2 diabetes mellitus with other specified complication: Secondary | ICD-10-CM

## 2024-03-11 DIAGNOSIS — E785 Hyperlipidemia, unspecified: Secondary | ICD-10-CM

## 2024-03-11 LAB — POCT GLYCOSYLATED HEMOGLOBIN (HGB A1C): HbA1c, POC (controlled diabetic range): 7.2 % — AB (ref 0.0–7.0)

## 2024-03-11 NOTE — Assessment & Plan Note (Signed)
BP remains well controlled with losartan at current strength.

## 2024-03-11 NOTE — Progress Notes (Signed)
 Gwendolyn Fleming - 58 y.o. female MRN [id number]  Date of birth: [birth year]  Subjective Chief Complaint  Patient presents with   Diabetes    HPI Gwendolyn Fleming is a 58 y.o. female here today for follow up visit.  She reports that she is doing well.   She remains on Jardiance  and Tresiba  @ 45 units.  A1c today is 7.2%.  Denies episodes of hypoglycemia.  She is somewhat active.  Tolerating atorvastatin  for associated HLD.   BP is managed with losartan .  BP is well controlled today.  She denies chest pain, shortness of breath, palpitations, headache or vision changes.   ROS:  A comprehensive ROS was completed and negative except as noted per HPI  Allergies  Allergen Reactions   Penicillins    Cephalosporins    Omeprazole Diarrhea   Metformin Nausea And Vomiting    Upset stomach   Other Rash    General Anesthesia   Tape Rash    Past Medical History:  Diagnosis Date   GERD (gastroesophageal reflux disease)    Hiatal hernia    Hyperlipemia    Hypertension    Peripheral neuropathy    Skin carcinoma    Type II diabetes mellitus (HCC)    Ulnar nerve damage    bilateral    Past Surgical History:  Procedure Laterality Date   ABDOMINAL HYSTERECTOMY     AUGMENTATION MAMMAPLASTY     BREAST REDUCTION SURGERY Bilateral 2001   CESAREAN SECTION     CHOLECYSTECTOMY     COLONOSCOPY  10/17/2016   [facility] [place] . Normal colonoscopy. In Epic   ESOPHAGOGASTRODUODENOSCOPY  10/17/2016   [facility] [place] . Normal upper endoscopy except a small hiatal hernia was observed. 17mm stricture was noted at esophago-gastric junction. Mucosa inside the stricture appear normal. Endoscope passed through. Multiple biopsies were taken. Dilitation: esophagus 37cm from the incisors up to 20mm   ESOPHAGOGASTRODUODENOSCOPY  01/28/2020   Small hiatal hernia.GE junction widely patent without stricture noted. s/p forcep biopsy taken from mid to distal esophagus.  Esophagus appeared normal. Stomach body and fundus areas several small 2 to 4 mm polyps scattered in stomach body/fundus areas, probably benign fundic gland polyps; s/p sample forceps biopsy taken from stomach body area.Stomach appeared normal. Duodenum apperared normal.    Social History   Socioeconomic History   Marital status: Married    Spouse name: Joani Cosma   Number of children: 2   Years of education: Not on file   Highest education level: Associate degree: occupational, Scientist, product/process development, or vocational program  Occupational History   Occupation: Scientist, clinical (histocompatibility and immunogenetics)  Tobacco Use   Smoking status: Never   Smokeless tobacco: Never  Vaping Use   Vaping status: Never Used  Substance and Sexual Activity   Alcohol use: Never   Drug use: Never   Sexual activity: Not Currently    Partners: Male  Other Topics Concern   Not on file  Social History Narrative   Not on file   Social Drivers of Health   Financial Resource Strain: Low Risk  (09/15/2023)   Overall Financial Resource Strain (CARDIA)    Difficulty of Paying Living Expenses: Not hard at all  Food Insecurity: No Food Insecurity (09/15/2023)   Hunger Vital Sign    Worried About Running Out of Food in the Last Year: Never true    Ran Out of Food in the Last Year: Never true  Transportation Needs: No Transportation Needs (09/15/2023)   PRAPARE - Transportation  Lack of Transportation (Medical): No    Lack of Transportation (Non-Medical): No  Physical Activity: Insufficiently Active (09/15/2023)   Exercise Vital Sign    Days of Exercise per Week: 2 days    Minutes of Exercise per Session: 20 min  Stress: No Stress Concern Present (09/15/2023)   [employer] of Occupational Health - Occupational Stress Questionnaire    Feeling of Stress : Only a little  Social Connections: Socially Integrated (09/15/2023)   Social Connection and Isolation Panel    Frequency of Communication with Friends and Family: More than three  times a week    Frequency of Social Gatherings with Friends and Family: Three times a week    Attends Religious Services: More than 4 times per year    Active Member of Clubs or Organizations: Yes    Attends Engineer, structural: More than 4 times per year    Marital Status: Married    Family History  Problem Relation Age of Onset   Skin cancer Mother    Irritable bowel syndrome Mother    Diabetes Father    Hypertension Father    Diabetes Brother    Diabetes Maternal Grandmother    Diverticulitis Maternal Grandmother    Breast cancer Cousin    Colon cancer Neg Hx    Rectal cancer Neg Hx    Pancreatic cancer Neg Hx    Esophageal cancer Neg Hx    Liver cancer Neg Hx    Stomach cancer Neg Hx     Health Maintenance  Topic Date Due   HIV Screening  Never done   Hepatitis C Screening  Never done   Pneumococcal Vaccine: 50+ Years (2 of 2 - PCV) 03/11/2025 (Originally 09/06/2006)   Hepatitis B Vaccines 19-59 Average Risk (2 of 3 - 19+ 3-dose series) 03/11/2025 (Originally 03/04/2020)   HEMOGLOBIN A1C  09/08/2024   Diabetic kidney evaluation - eGFR measurement  09/18/2024   Diabetic kidney evaluation - Urine ACR  09/18/2024   FOOT EXAM  09/18/2024   OPHTHALMOLOGY EXAM  10/18/2024   MAMMOGRAM  04/25/2025   Colonoscopy  10/18/2026   DTaP/Tdap/Td (3 - Td or Tdap) 08/01/2030   HPV VACCINES  Aged Out   Meningococcal B Vaccine  Aged Out   Influenza Vaccine  Discontinued   COVID-19 Vaccine  Discontinued   Zoster Vaccines- Shingrix  Discontinued     ----------------------------------------------------------------------------------------------------------------------------------------------------------------------------------------------------------------- Physical Exam BP 127/76 (BP Location: Left Arm, Patient Position: Sitting, Cuff Size: Large)   Pulse 60   Ht 6' (1.829 m)   Wt 226 lb (102.5 kg)   SpO2 98%   BMI 30.65 kg/m   Physical Exam Constitutional:       Appearance: Normal appearance.  Eyes:     General: No scleral icterus. Cardiovascular:     Rate and Rhythm: Normal rate and regular rhythm.  Pulmonary:     Effort: Pulmonary effort is normal.     Breath sounds: Normal breath sounds.  Musculoskeletal:     Cervical back: Neck supple.  Neurological:     General: No focal deficit present.     Mental Status: She is alert.  Psychiatric:        Mood and Affect: Mood normal.        Behavior: Behavior normal.     ------------------------------------------------------------------------------------------------------------------------------------------------------------------------------------------------------------------- Assessment and Plan  Type 2 diabetes mellitus with diabetic polyneuropathy (HCC) Diabetes remains fairly well controlled.  Continue Tresiba  and Jardiance  at current strength.  HTN (hypertension) BP remains well controlled with losartan   at current strength.    Hyperlipidemia associated with type 2 diabetes mellitus (HCC) Tolerating atoravastatin at current strength.   Lab Results  Component Value Date   LDLCALC 63 09/19/2023     No orders of the defined types were placed in this encounter.   Return in about 6 months (around 09/08/2024) for Hypertension, Type 2 Diabetes.

## 2024-03-11 NOTE — Assessment & Plan Note (Signed)
 Tolerating atoravastatin at current strength.   Lab Results  Component Value Date   LDLCALC 63 09/19/2023

## 2024-03-11 NOTE — Assessment & Plan Note (Signed)
Diabetes remains fairly well controlled.  Continue Evaristo Bury and Jardiance at current strength.

## 2024-03-25 ENCOUNTER — Ambulatory Visit: Admitting: Family Medicine

## 2024-04-16 ENCOUNTER — Other Ambulatory Visit: Payer: Self-pay | Admitting: Family Medicine

## 2024-04-16 DIAGNOSIS — Z1231 Encounter for screening mammogram for malignant neoplasm of breast: Secondary | ICD-10-CM

## 2024-04-19 ENCOUNTER — Other Ambulatory Visit: Payer: Self-pay | Admitting: Family Medicine

## 2024-04-19 DIAGNOSIS — I10 Essential (primary) hypertension: Secondary | ICD-10-CM

## 2024-05-06 ENCOUNTER — Other Ambulatory Visit: Payer: Self-pay | Admitting: Family Medicine

## 2024-05-06 DIAGNOSIS — E1169 Type 2 diabetes mellitus with other specified complication: Secondary | ICD-10-CM

## 2024-05-09 ENCOUNTER — Other Ambulatory Visit: Payer: Self-pay | Admitting: Family Medicine

## 2024-05-09 DIAGNOSIS — R1319 Other dysphagia: Secondary | ICD-10-CM

## 2024-05-11 ENCOUNTER — Ambulatory Visit (INDEPENDENT_AMBULATORY_CARE_PROVIDER_SITE_OTHER)

## 2024-05-11 DIAGNOSIS — Z1231 Encounter for screening mammogram for malignant neoplasm of breast: Secondary | ICD-10-CM

## 2024-05-24 ENCOUNTER — Other Ambulatory Visit: Payer: Self-pay | Admitting: Family Medicine

## 2024-05-24 DIAGNOSIS — E1142 Type 2 diabetes mellitus with diabetic polyneuropathy: Secondary | ICD-10-CM

## 2024-08-04 ENCOUNTER — Other Ambulatory Visit: Payer: Self-pay | Admitting: Family Medicine

## 2024-08-04 ENCOUNTER — Encounter: Payer: Self-pay | Admitting: Family Medicine

## 2024-08-04 DIAGNOSIS — R1319 Other dysphagia: Secondary | ICD-10-CM

## 2024-08-04 DIAGNOSIS — E1142 Type 2 diabetes mellitus with diabetic polyneuropathy: Secondary | ICD-10-CM

## 2024-09-09 ENCOUNTER — Ambulatory Visit: Admitting: Family Medicine

## 2024-09-16 ENCOUNTER — Ambulatory Visit: Admitting: Family Medicine
